# Patient Record
Sex: Female | Born: 1965
Health system: Southern US, Community
[De-identification: ages and names within clinical notes are randomized; demographics above are authoritative.]

## PROBLEM LIST (undated history)

## (undated) DIAGNOSIS — E119 Type 2 diabetes mellitus without complications: Secondary | ICD-10-CM

## (undated) DIAGNOSIS — F419 Anxiety disorder, unspecified: Secondary | ICD-10-CM

## (undated) DIAGNOSIS — K219 Gastro-esophageal reflux disease without esophagitis: Secondary | ICD-10-CM

## (undated) DIAGNOSIS — E039 Hypothyroidism, unspecified: Secondary | ICD-10-CM

## (undated) DIAGNOSIS — T7840XA Allergy, unspecified, initial encounter: Secondary | ICD-10-CM

## (undated) HISTORY — DX: Allergy, unspecified, initial encounter: T78.40XA

## (undated) HISTORY — DX: Gastro-esophageal reflux disease without esophagitis: K21.9

## (undated) HISTORY — PX: HERNIA REPAIR: SHX51

## (undated) HISTORY — DX: Hypothyroidism, unspecified: E03.9

## (undated) HISTORY — DX: Anxiety disorder, unspecified: F41.9

## (undated) HISTORY — PX: BREAST SURGERY: SHX581

## (undated) HISTORY — DX: Type 2 diabetes mellitus without complications: E11.9

## (undated) HISTORY — PX: CYSTECTOMY: SUR359

## (undated) HISTORY — PX: TUBAL LIGATION: SHX77

---

## 2011-01-02 ENCOUNTER — Ambulatory Visit (INDEPENDENT_AMBULATORY_CARE_PROVIDER_SITE_OTHER): Payer: Managed Care, Other (non HMO) | Admitting: Internal Medicine

## 2011-01-02 ENCOUNTER — Encounter: Payer: Self-pay | Admitting: Internal Medicine

## 2011-01-02 VITALS — BP 120/70 | HR 91 | Temp 98.9°F | Ht 63.0 in | Wt 191.0 lb

## 2011-01-02 DIAGNOSIS — Z0000 Encounter for general adult medical examination without abnormal findings: Secondary | ICD-10-CM

## 2011-01-02 DIAGNOSIS — Z79899 Other long term (current) drug therapy: Secondary | ICD-10-CM

## 2011-01-02 DIAGNOSIS — E039 Hypothyroidism, unspecified: Secondary | ICD-10-CM | POA: Insufficient documentation

## 2011-01-02 DIAGNOSIS — F419 Anxiety disorder, unspecified: Secondary | ICD-10-CM | POA: Insufficient documentation

## 2011-01-02 DIAGNOSIS — D649 Anemia, unspecified: Secondary | ICD-10-CM

## 2011-01-02 DIAGNOSIS — E785 Hyperlipidemia, unspecified: Secondary | ICD-10-CM | POA: Insufficient documentation

## 2011-01-02 DIAGNOSIS — Z23 Encounter for immunization: Secondary | ICD-10-CM

## 2011-01-02 LAB — BASIC METABOLIC PANEL
BUN: 10 mg/dL (ref 6–23)
Calcium: 9.2 mg/dL (ref 8.4–10.5)
GFR: 73.49 mL/min (ref >60.00)
Glucose, Bld: 85 mg/dL (ref 70–99)
Potassium: 4.1 mEq/L (ref 3.5–5.1)
Sodium: 140 mEq/L (ref 135–145)

## 2011-01-02 LAB — HEPATIC FUNCTION PANEL
AST: 26 U/L (ref 0–37)
Albumin: 4.2 g/dL (ref 3.5–5.2)
Alkaline Phosphatase: 58 U/L (ref 39–117)
Total Bilirubin: 0.6 mg/dL (ref 0.3–1.2)

## 2011-01-02 LAB — CBC WITH DIFFERENTIAL/PLATELET
Eosinophils Absolute: 0.4 10*3/uL (ref 0.0–0.7)
HCT: 37.9 % (ref 36.0–46.0)
Lymphs Abs: 1.6 10*3/uL (ref 0.7–4.0)
MCHC: 34.2 g/dL (ref 30.0–36.0)
MCV: 87.4 fl (ref 78.0–100.0)
Monocytes Absolute: 0.6 10*3/uL (ref 0.1–1.0)
Neutrophils Relative %: 56.5 % (ref 43.0–77.0)
Platelets: 207 10*3/uL (ref 150.0–400.0)

## 2011-01-02 LAB — LDL CHOLESTEROL, DIRECT: Direct LDL: 133.7 mg/dL

## 2011-01-02 LAB — T4, FREE: Free T4: 0.93 ng/dL (ref 0.60–1.60)

## 2011-01-02 LAB — TSH: TSH: 0.52 u[IU]/mL (ref 0.35–5.50)

## 2011-01-02 MED ORDER — VENLAFAXINE HCL ER 37.5 MG PO CP24: 37.5000 mg | ORAL_CAPSULE | Freq: Every day | ORAL | Status: DC

## 2011-01-02 MED ORDER — OMEPRAZOLE 20 MG PO CPDR: 20.0000 mg | DELAYED_RELEASE_CAPSULE | Freq: Every day | ORAL | Status: DC

## 2011-01-02 MED ORDER — LEVOTHYROXINE SODIUM 150 MCG PO TABS: 150.0000 ug | ORAL_TABLET | Freq: Every day | ORAL | Status: DC

## 2011-01-02 MED ORDER — DIAZEPAM 5 MG PO TABS: 5.0000 mg | ORAL_TABLET | Freq: Two times a day (BID) | ORAL | Status: DC | PRN

## 2011-01-02 NOTE — Assessment & Plan Note (Signed)
Obtain fasting lipid profile.

## 2011-01-02 NOTE — Assessment & Plan Note (Signed)
Obtain TSH and free T4. RF synthroid at current dose pending lab results.

## 2011-01-02 NOTE — Progress Notes (Signed)
  Subjective:    Patient ID: Gabriela Conley, female    DOB: 25-Apr-1966, 45 y.o.   MRN: [id number]  HPI Pt presents to clinic to establish primary care. Moved from [place] 05/2010 and had CPE with labs and pap prior to the move. H/o hypothyroidism on stable dose of synthroid. Compliant with medication without adverse effect. Notes unintended wt gain. Recalls past h/o anemia thought to be iron deficiency. Was recommended for po iron supplementation however states takes periodically. No gross active bleeding. H/o GERD well controlled with daily ppi without breakthrough sx's. No heartburn or dysphagia. In past cholesterol has been mildly elevated but has not required medication. Notes h/o anxiety which was previously treated with effexor xr without adverse effect. Pt self dc'ed medication. Recently has been tx'ed with valium prn and takes the medication several days a week. No alleviating or exacerbating factors.   Reviewed PMH, PSH, medications, allergies, social hx and family hx.   Review of Systems  Constitutional: Negative for fever, chills and fatigue.  HENT: Positive for congestion. Negative for hearing loss, ear pain and facial swelling.   Eyes: Negative for photophobia, pain and discharge.  Respiratory: Negative for cough, shortness of breath and wheezing.   Cardiovascular: Negative for chest pain and palpitations.  Gastrointestinal: Negative for nausea, vomiting, abdominal pain, diarrhea, constipation and blood in stool.  Genitourinary: Negative for frequency, hematuria and difficulty urinating.  Musculoskeletal: Negative for myalgias and back pain.  Skin: Negative for color change, pallor and rash.  Neurological: Negative for dizziness, seizures, syncope and numbness.  Hematological: Negative for adenopathy. Does not bruise/bleed easily.  Psychiatric/Behavioral: Negative for behavioral problems and agitation. The patient is nervous/anxious.        Objective:   Physical Exam  Constitutional: She  appears well-developed and well-nourished. No distress.  HENT:  Head: Normocephalic and atraumatic.  Right Ear: Tympanic membrane, external ear and ear canal normal.  Left Ear: Tympanic membrane, external ear and ear canal normal.  Nose: Nose normal.  Mouth/Throat: Oropharynx is clear and moist. No oropharyngeal exudate.  Eyes: Conjunctivae are normal. Right eye exhibits no discharge. Left eye exhibits no discharge. No scleral icterus.  Neck: Neck supple. Carotid bruit is not present. No thyromegaly present.  Cardiovascular: Normal rate, regular rhythm and normal heart sounds.  Exam reveals no gallop and no friction rub.   No murmur heard. Pulmonary/Chest: Effort normal and breath sounds normal. No respiratory distress. She has no wheezes. She has no rales.  Abdominal: Soft. Bowel sounds are normal. She exhibits no distension and no mass. There is no hepatosplenomegaly. There is no tenderness. There is no rebound and no guarding.  Lymphadenopathy:    She has no cervical adenopathy.  Neurological: She is alert. Gait normal.  Skin: Skin is warm and dry. No rash noted. She is not diaphoretic. No erythema.  Psychiatric: She has a normal mood and affect. Her speech is normal and behavior is normal. Cognition and memory are not impaired. She does not express impulsivity or inappropriate judgment.          Assessment & Plan:

## 2011-01-02 NOTE — Assessment & Plan Note (Signed)
Obtain CBC and iron level

## 2011-01-09 ENCOUNTER — Telehealth: Payer: Self-pay

## 2011-01-09 NOTE — Telephone Encounter (Signed)
Message copied by Kyung Rudd on Wed Jan 09, 2011  8:52 AM ------      Message from: Letitia Libra, Maisie Fus      Created: Tue Jan 08, 2011  9:31 PM       Labs nl except chol mildly abnormal. Low fat diet and regular exercise.       Not anemic. Thyroid ok.

## 2011-01-09 NOTE — Telephone Encounter (Signed)
Pt aware.

## 2011-07-12 ENCOUNTER — Other Ambulatory Visit: Payer: Managed Care, Other (non HMO)

## 2011-07-16 ENCOUNTER — Other Ambulatory Visit (INDEPENDENT_AMBULATORY_CARE_PROVIDER_SITE_OTHER): Payer: Managed Care, Other (non HMO)

## 2011-07-16 DIAGNOSIS — Z0000 Encounter for general adult medical examination without abnormal findings: Secondary | ICD-10-CM

## 2011-07-16 LAB — POCT URINALYSIS DIPSTICK
Blood, UA: NEGATIVE
Glucose, UA: NEGATIVE
Ketones, UA: NEGATIVE
Spec Grav, UA: 1.02

## 2011-07-16 LAB — CBC WITH DIFFERENTIAL/PLATELET
Eosinophils Relative: 4.7 % (ref 0.0–5.0)
HCT: 39 % (ref 36.0–46.0)
Hemoglobin: 12.8 g/dL (ref 12.0–15.0)
Lymphs Abs: 1.3 10*3/uL (ref 0.7–4.0)
Monocytes Relative: 9.6 % (ref 3.0–12.0)
Neutro Abs: 4.7 10*3/uL (ref 1.4–7.7)
Platelets: 179 10*3/uL (ref 150.0–400.0)
RBC: 4.45 Mil/uL (ref 3.87–5.11)
WBC: 7.1 10*3/uL (ref 4.5–10.5)

## 2011-07-16 LAB — HEPATIC FUNCTION PANEL
ALT: 24 U/L (ref 0–35)
Albumin: 4.2 g/dL (ref 3.5–5.2)
Total Bilirubin: 0.4 mg/dL (ref 0.3–1.2)
Total Protein: 7.1 g/dL (ref 6.0–8.3)

## 2011-07-16 LAB — BASIC METABOLIC PANEL
GFR: 90.71 mL/min (ref >60.00)
Potassium: 3.8 mEq/L (ref 3.5–5.1)
Sodium: 139 mEq/L (ref 135–145)

## 2011-07-16 LAB — LIPID PANEL
Cholesterol: 206 mg/dL — ABNORMAL HIGH (ref 0–200)
HDL: 38.1 mg/dL — ABNORMAL LOW (ref >39.00)
Triglycerides: 213 mg/dL — ABNORMAL HIGH (ref 0.0–149.0)

## 2011-07-16 LAB — TSH: TSH: 1.79 u[IU]/mL (ref 0.35–5.50)

## 2011-07-17 LAB — LDL CHOLESTEROL, DIRECT: Direct LDL: 139.3 mg/dL

## 2011-07-19 ENCOUNTER — Encounter: Payer: Self-pay | Admitting: Family Medicine

## 2011-07-19 ENCOUNTER — Other Ambulatory Visit (HOSPITAL_COMMUNITY)
Admission: RE | Admit: 2011-07-19 | Discharge: 2011-07-19 | Disposition: A | Discharge status: Home / Self Care | Payer: Managed Care, Other (non HMO) | Source: Ambulatory Visit | Attending: Family Medicine | Admitting: Family Medicine

## 2011-07-19 ENCOUNTER — Encounter: Payer: Managed Care, Other (non HMO) | Admitting: Internal Medicine

## 2011-07-19 ENCOUNTER — Ambulatory Visit (INDEPENDENT_AMBULATORY_CARE_PROVIDER_SITE_OTHER): Payer: Managed Care, Other (non HMO) | Admitting: Family Medicine

## 2011-07-19 VITALS — BP 118/78 | HR 93 | Temp 99.0°F | Ht 63.25 in | Wt 193.0 lb

## 2011-07-19 DIAGNOSIS — Z0000 Encounter for general adult medical examination without abnormal findings: Secondary | ICD-10-CM

## 2011-07-19 DIAGNOSIS — Z01419 Encounter for gynecological examination (general) (routine) without abnormal findings: Secondary | ICD-10-CM | POA: Insufficient documentation

## 2011-07-19 MED ORDER — DIAZEPAM 5 MG PO TABS: 5.0000 mg | ORAL_TABLET | Freq: Two times a day (BID) | ORAL | Status: DC | PRN

## 2011-07-19 MED ORDER — DICLOFENAC SODIUM 50 MG PO TBEC: 50.0000 mg | DELAYED_RELEASE_TABLET | Freq: Three times a day (TID) | ORAL | Status: DC | PRN

## 2011-07-19 MED ORDER — CYCLOBENZAPRINE HCL 10 MG PO TABS: 10.0000 mg | ORAL_TABLET | Freq: Three times a day (TID) | ORAL | Status: AC | PRN

## 2011-07-19 MED ORDER — LASIX 20 MG PO TABS: 20.0000 mg | ORAL_TABLET | Freq: Every day | ORAL | Status: DC | PRN

## 2011-07-19 NOTE — Progress Notes (Signed)
Subjective:    Patient ID: Gabriela Conley, female    DOB: Mar 22, 1966, 45 y.o.   MRN: [id number]  HPI 45 yr old female for a cpx. She is doing well except for some middle back pain and stiffness that started about 3 months ago. No hx of trauma. The pains are mostly on the left side, and they come and go. Heat and 800 mg of Ibuprofen do not help much. Her menses are regular.    Review of Systems  Constitutional: Negative.  Negative for fever, diaphoresis, activity change, appetite change, fatigue and unexpected weight change.  HENT: Negative.  Negative for hearing loss, ear pain, nosebleeds, congestion, sore throat, trouble swallowing, neck pain, neck stiffness, voice change and tinnitus.   Eyes: Negative.  Negative for photophobia, pain, discharge, redness and visual disturbance.  Respiratory: Negative.  Negative for apnea, cough, choking, chest tightness, shortness of breath, wheezing and stridor.   Cardiovascular: Negative.  Negative for chest pain, palpitations and leg swelling.  Gastrointestinal: Negative.  Negative for nausea, vomiting, abdominal pain, diarrhea, constipation, blood in stool, abdominal distention and rectal pain.  Genitourinary: Negative.  Negative for dysuria, urgency, frequency, hematuria, flank pain, vaginal bleeding, vaginal discharge, enuresis, difficulty urinating, vaginal pain and menstrual problem.  Musculoskeletal: Negative.  Negative for myalgias, back pain, joint swelling, arthralgias and gait problem.  Skin: Negative.  Negative for color change, pallor, rash and wound.  Neurological: Negative.  Negative for dizziness, tremors, seizures, syncope, speech difficulty, weakness, light-headedness, numbness and headaches.  Hematological: Negative.  Negative for adenopathy. Does not bruise/bleed easily.  Psychiatric/Behavioral: Negative.  Negative for hallucinations, behavioral problems, confusion, sleep disturbance, dysphoric mood and agitation. The patient is not  nervous/anxious.        Objective:   Physical Exam  Constitutional: She appears well-developed and well-nourished. No distress.  HENT:  Head: Normocephalic and atraumatic.  Right Ear: External ear normal.  Left Ear: External ear normal.  Nose: Nose normal.  Mouth/Throat: Oropharynx is clear and moist. No oropharyngeal exudate.  Eyes: Conjunctivae and EOM are normal. Pupils are equal, round, and reactive to light. Right eye exhibits no discharge. Left eye exhibits no discharge. No scleral icterus.  Neck: Normal range of motion. Neck supple. No JVD present. No thyromegaly present.  Cardiovascular: Normal rate, regular rhythm, normal heart sounds and intact distal pulses.  Exam reveals no gallop and no friction rub.   No murmur heard. Pulmonary/Chest: Effort normal and breath sounds normal. No stridor. No respiratory distress. She has no wheezes. She has no rales. She exhibits no tenderness.  Abdominal: Soft. Normal appearance and bowel sounds are normal. She exhibits no distension, no abdominal bruit, no ascites and no mass. There is no hepatosplenomegaly. There is no tenderness. There is no rigidity, no rebound and no guarding. No hernia.  Genitourinary: Rectum normal, vagina normal and uterus normal. No breast swelling, tenderness, discharge or bleeding. Cervix exhibits no motion tenderness, no discharge and no friability. Right adnexum displays no mass, no tenderness and no fullness. Left adnexum displays no mass, no tenderness and no fullness. No erythema, tenderness or bleeding around the vagina. No vaginal discharge found.  Musculoskeletal: Normal range of motion. She exhibits no edema and no tenderness.  Lymphadenopathy:    She has no cervical adenopathy.  Neurological: She is alert. She has normal reflexes. No cranial nerve deficit. She exhibits normal muscle tone. Coordination normal.  Skin: Skin is warm and dry. No rash noted. She is not diaphoretic. No erythema. No pallor.  Psychiatric: She has a normal mood and affect. Her behavior is normal. Judgment and thought content normal.          Assessment & Plan:  Well exam. She has muscle spasms in the back. Try Flexeril and Diclofenac. I suggested she get a massage. She needs to set up a mammogram soon.

## 2011-07-22 ENCOUNTER — Encounter: Payer: Managed Care, Other (non HMO) | Admitting: Internal Medicine

## 2011-07-25 ENCOUNTER — Telehealth: Payer: Self-pay | Admitting: Family Medicine

## 2011-07-25 NOTE — Telephone Encounter (Signed)
Spoke with pt and gave results. 

## 2011-07-25 NOTE — Telephone Encounter (Signed)
Message copied by Baldemar Friday on Thu Jul 25, 2011  3:17 PM ------      Message from: Gershon Crane A      Created: Wed Jul 24, 2011  8:46 AM       Normal, repeat one year

## 2011-08-20 ENCOUNTER — Encounter: Payer: Self-pay | Admitting: Family Medicine

## 2011-08-20 ENCOUNTER — Ambulatory Visit (INDEPENDENT_AMBULATORY_CARE_PROVIDER_SITE_OTHER): Payer: Managed Care, Other (non HMO) | Admitting: Family Medicine

## 2011-08-20 VITALS — BP 111/62 | HR 86 | Temp 99.3°F | Wt 199.0 lb

## 2011-08-20 DIAGNOSIS — J329 Chronic sinusitis, unspecified: Secondary | ICD-10-CM

## 2011-08-20 MED ORDER — AMOXICILLIN-POT CLAVULANATE 875-125 MG PO TABS: 1.0000 | ORAL_TABLET | Freq: Two times a day (BID) | ORAL | Status: AC

## 2011-08-20 NOTE — Progress Notes (Signed)
  Subjective:    Patient ID: Gabriela Conley, female    DOB: [birth year], 45 y.o.   MRN: [id number]  HPI Here for one week of sinus pressure, PND, HA, and a dry cough. Low grade fevers.    Review of Systems  Constitutional: Positive for fever.  HENT: Positive for congestion, postnasal drip and sinus pressure.   Eyes: Negative.   Respiratory: Positive for cough.        Objective:   Physical Exam  Constitutional: She appears well-developed and well-nourished.  HENT:  Right Ear: External ear normal.  Left Ear: External ear normal.  Nose: Nose normal.  Mouth/Throat: Oropharynx is clear and moist. No oropharyngeal exudate.  Eyes: Conjunctivae are normal. Pupils are equal, round, and reactive to light.  Neck: No thyromegaly present.  Pulmonary/Chest: Effort normal and breath sounds normal.  Lymphadenopathy:    She has no cervical adenopathy.          Assessment & Plan:  Out of work today

## 2011-12-20 ENCOUNTER — Ambulatory Visit (INDEPENDENT_AMBULATORY_CARE_PROVIDER_SITE_OTHER): Payer: Managed Care, Other (non HMO) | Admitting: Family

## 2011-12-20 ENCOUNTER — Encounter: Payer: Self-pay | Admitting: Family

## 2011-12-20 VITALS — HR 91 | Temp 98.3°F | Wt 198.0 lb

## 2011-12-20 DIAGNOSIS — J309 Allergic rhinitis, unspecified: Secondary | ICD-10-CM

## 2011-12-20 DIAGNOSIS — E039 Hypothyroidism, unspecified: Secondary | ICD-10-CM

## 2011-12-20 DIAGNOSIS — F411 Generalized anxiety disorder: Secondary | ICD-10-CM

## 2011-12-20 DIAGNOSIS — F419 Anxiety disorder, unspecified: Secondary | ICD-10-CM

## 2011-12-20 MED ORDER — FEXOFENADINE-PSEUDOEPHED ER 180-240 MG PO TB24: 1.0000 | ORAL_TABLET | Freq: Every day | ORAL | Status: DC

## 2011-12-20 MED ORDER — DIAZEPAM 5 MG PO TABS: 5.0000 mg | ORAL_TABLET | Freq: Two times a day (BID) | ORAL | Status: DC | PRN

## 2011-12-20 MED ORDER — LEVOTHYROXINE SODIUM 150 MCG PO TABS: 150.0000 ug | ORAL_TABLET | Freq: Every day | ORAL | Status: DC

## 2011-12-20 NOTE — Progress Notes (Signed)
Subjective:    Patient ID: Gabriela Conley, female    DOB: 1966-02-24, 46 y.o.   MRN: [id number]  HPI Comments: C/o sinus congestion, mild headaches, itchy/watery eyes, productive cough with green-yellow tinged mucus expectorated x five days. Denies fever, chills, nausea, or vomiting. Mucinex and tylenol sinus OTC ineffective. REquest refill synthroid and diazepam.   Sinusitis Associated symptoms include congestion, sinus pressure and a sore throat. Pertinent negatives include no ear pain or sneezing.      Review of Systems  Constitutional: Negative.   HENT: Positive for congestion, sore throat, postnasal drip and sinus pressure. Negative for hearing loss, ear pain, facial swelling, rhinorrhea, sneezing, trouble swallowing, dental problem and ear discharge.   Eyes: Negative.   Respiratory: Negative.   Cardiovascular: Negative.    Past Medical History  Diagnosis Date  . Asthma   . Allergy   . Hypothyroid   . GERD (gastroesophageal reflux disease)   . Anxiety     History   Social History  . Marital Status: Single    Spouse Name: N/A    Number of Children: N/A  . Years of Education: N/A   Occupational History  . Not on file.   Social History Main Topics  . Smoking status: Former Games developer  . Smokeless tobacco: Never Used  . Alcohol Use: Yes  . Drug Use: No  . Sexually Active: Not on file   Other Topics Concern  . Not on file   Social History Narrative  . No narrative on file    Past Surgical History  Procedure Date  . Cystectomy     removed from chest  . Hernia repair   . Breast surgery     removal of benign cyst     Family History  Problem Relation Age of Onset  . Uterine cancer Mother   . Emphysema Mother   . Stomach cancer Father     No Known Allergies  Current Outpatient Prescriptions on File Prior to Visit  Medication Sig Dispense Refill  . albuterol (PROAIR HFA) 108 (90 BASE) MCG/ACT inhaler Inhale 2 puffs into the lungs every 6 (six) hours as  needed.        . diazepam (VALIUM) 5 MG tablet Take 1 tablet (5 mg total) by mouth every 12 (twelve) hours as needed for anxiety.  180 tablet  1  . diclofenac (VOLTAREN) 50 MG EC tablet Take 1 tablet (50 mg total) by mouth 3 (three) times daily as needed (pain ).  60 tablet  5  . levothyroxine (SYNTHROID, LEVOTHROID) 150 MCG tablet Take 1 tablet (150 mcg total) by mouth daily.  90 tablet  3  . omeprazole (PRILOSEC) 20 MG capsule Take 1 capsule (20 mg total) by mouth daily.  90 capsule  3  . venlafaxine (EFFEXOR XR) 37.5 MG 24 hr capsule Take 1 capsule (37.5 mg total) by mouth daily.  30 capsule  6  . LASIX 20 MG tablet Take 1 tablet (20 mg total) by mouth daily as needed.  30 tablet  11  . loratadine (CLARITIN) 10 MG tablet Take 10 mg by mouth daily.          Pulse 91  Temp(Src) 98.3 F (36.8 C) (Oral)  Wt 198 lb (89.812 kg)  SpO2 98%chart    Objective:   Physical Exam  Constitutional: She is oriented to person, place, and time. She appears well-developed and well-nourished. No distress.  HENT:  Right Ear: External ear normal.  Left Ear: External ear normal.  Nose: Nose normal.  Mouth/Throat: Oropharynx is clear and moist. No oropharyngeal exudate.  Eyes: Right eye exhibits no discharge. Left eye exhibits no discharge.  Cardiovascular: Normal rate, regular rhythm, normal heart sounds and intact distal pulses.  Exam reveals no gallop and no friction rub.   No murmur heard. Pulmonary/Chest: Effort normal and breath sounds normal. No respiratory distress. She has no wheezes. She has no rales. She exhibits no tenderness.  Neurological: She is alert and oriented to person, place, and time.  Skin: Skin is warm and dry.          Assessment & Plan:  Assessent: Allergic Rhinitis-uncontrolled, Hypothyroidism, Anxiety  Plan: Allegra-d, synthroid, diazepam, increase po fluid intake, lab: TSH,  follow up with primary care MD, teaching handout provided sinusitis and hypothryoidism

## 2011-12-20 NOTE — Patient Instructions (Signed)

## 2012-01-17 ENCOUNTER — Ambulatory Visit (INDEPENDENT_AMBULATORY_CARE_PROVIDER_SITE_OTHER): Payer: Managed Care, Other (non HMO) | Admitting: Family Medicine

## 2012-01-17 ENCOUNTER — Encounter: Payer: Self-pay | Admitting: Family Medicine

## 2012-01-17 VITALS — BP 110/82 | HR 83 | Temp 98.7°F | Wt 191.0 lb

## 2012-01-17 DIAGNOSIS — J329 Chronic sinusitis, unspecified: Secondary | ICD-10-CM

## 2012-01-17 MED ORDER — AZITHROMYCIN 250 MG PO TABS: ORAL_TABLET | ORAL | Status: AC

## 2012-01-17 MED ORDER — DIAZEPAM 5 MG PO TABS: 5.0000 mg | ORAL_TABLET | Freq: Two times a day (BID) | ORAL | Status: DC | PRN

## 2012-01-17 NOTE — Progress Notes (Signed)
  Subjective:    Patient ID: Gabriela Conley, female    DOB: 1966/04/21, 46 y.o.   MRN: [id number]  HPI Here for 3 weeks of sinus pressure, PND, and coughing up green sputum. No fever. On Allegra.    Review of Systems  Constitutional: Negative.   HENT: Positive for congestion, postnasal drip and sinus pressure.   Eyes: Negative.   Respiratory: Positive for cough.        Objective:   Physical Exam  Constitutional: She appears well-developed and well-nourished.  HENT:  Right Ear: External ear normal.  Left Ear: External ear normal.  Nose: Nose normal.  Mouth/Throat: Oropharynx is clear and moist. No oropharyngeal exudate.  Eyes: Conjunctivae are normal.  Neck: Neck supple.  Pulmonary/Chest: Effort normal and breath sounds normal.  Lymphadenopathy:    She has no cervical adenopathy.          Assessment & Plan:  Switch to Mucinex

## 2012-01-20 ENCOUNTER — Ambulatory Visit: Payer: Managed Care, Other (non HMO) | Admitting: Family Medicine

## 2012-01-22 ENCOUNTER — Other Ambulatory Visit: Payer: Self-pay | Admitting: Internal Medicine

## 2012-03-02 ENCOUNTER — Telehealth: Payer: Self-pay | Admitting: Family Medicine

## 2012-03-02 MED ORDER — LEVOTHYROXINE SODIUM 150 MCG PO TABS: 150.0000 ug | ORAL_TABLET | Freq: Every day | ORAL | Status: DC

## 2012-03-02 NOTE — Telephone Encounter (Signed)
Pt requested a 90 day supply of Synthroid [facility], I sent e-scribe.

## 2012-03-10 ENCOUNTER — Telehealth: Payer: Self-pay | Admitting: Family Medicine

## 2012-03-10 NOTE — Telephone Encounter (Signed)
Okay for one year supply  

## 2012-03-10 NOTE — Telephone Encounter (Signed)
Refill request for Venlafaxine HCL ER 37.5 mg take 1 po qd and pt last here on 01/17/12. ( 90 day supply )

## 2012-03-11 MED ORDER — VENLAFAXINE HCL ER 37.5 MG PO CP24: 37.5000 mg | ORAL_CAPSULE | Freq: Every day | ORAL | Status: DC

## 2012-03-11 NOTE — Telephone Encounter (Signed)
Script was sent e-scribe 

## 2012-07-03 ENCOUNTER — Ambulatory Visit (INDEPENDENT_AMBULATORY_CARE_PROVIDER_SITE_OTHER): Payer: Managed Care, Other (non HMO) | Admitting: Family Medicine

## 2012-07-03 ENCOUNTER — Encounter: Payer: Self-pay | Admitting: Family Medicine

## 2012-07-03 VITALS — BP 108/70 | HR 91 | Temp 98.7°F | Wt 192.0 lb

## 2012-07-03 DIAGNOSIS — J329 Chronic sinusitis, unspecified: Secondary | ICD-10-CM

## 2012-07-03 DIAGNOSIS — E039 Hypothyroidism, unspecified: Secondary | ICD-10-CM

## 2012-07-03 LAB — BASIC METABOLIC PANEL
Chloride: 102 mEq/L (ref 96–112)
Glucose, Bld: 107 mg/dL — ABNORMAL HIGH (ref 70–99)
Potassium: 3.7 mEq/L (ref 3.5–5.3)
Sodium: 139 mEq/L (ref 135–145)

## 2012-07-03 LAB — CBC WITH DIFFERENTIAL/PLATELET
Basophils Absolute: 0 10*3/uL (ref 0.0–0.1)
Eosinophils Relative: 5 % (ref 0–5)
HCT: 39.7 % (ref 36.0–46.0)
Lymphocytes Relative: 19 % (ref 12–46)
Lymphs Abs: 1.5 10*3/uL (ref 0.7–4.0)
MCH: 30.4 pg (ref 26.0–34.0)
MCV: 88.6 fL (ref 78.0–100.0)
Monocytes Absolute: 0.8 10*3/uL (ref 0.1–1.0)
RDW: 13.4 % (ref 11.5–15.5)
WBC: 7.9 10*3/uL (ref 4.0–10.5)

## 2012-07-03 MED ORDER — AZITHROMYCIN 250 MG PO TABS: ORAL_TABLET | ORAL | Status: DC

## 2012-07-03 NOTE — Addendum Note (Signed)
Addended by: Mauri Reading on: 07/03/2012 04:02 PM   Modules accepted: Orders

## 2012-07-03 NOTE — Progress Notes (Signed)
  Subjective:    Patient ID: Gabriela Conley, female    DOB: 12/04/1965, 46 y.o.   MRN: [id number]  HPI Here for one week of sinus pressure, PND, ST, and a dry cough. No fever.    Review of Systems  Constitutional: Negative.   HENT: Positive for congestion, postnasal drip and sinus pressure.   Eyes: Negative.   Respiratory: Positive for cough.        Objective:   Physical Exam  Constitutional: She appears well-developed and well-nourished.  HENT:  Right Ear: External ear normal.  Left Ear: External ear normal.  Nose: Nose normal.  Mouth/Throat: Oropharynx is clear and moist. No oropharyngeal exudate.  Eyes: Conjunctivae are normal.  Pulmonary/Chest: Effort normal and breath sounds normal.  Lymphadenopathy:    She has no cervical adenopathy.          Assessment & Plan:  Add Mucinex

## 2012-07-07 ENCOUNTER — Other Ambulatory Visit: Payer: Self-pay | Admitting: Family Medicine

## 2012-07-07 MED ORDER — LEVOTHYROXINE SODIUM 200 MCG PO TABS: 200.0000 ug | ORAL_TABLET | Freq: Every day | ORAL | Status: DC

## 2012-07-07 NOTE — Progress Notes (Signed)
Quick Note:  Left a message for pt to return call. ______ 

## 2012-08-21 ENCOUNTER — Telehealth: Payer: Self-pay | Admitting: Family Medicine

## 2012-08-21 DIAGNOSIS — Z0000 Encounter for general adult medical examination without abnormal findings: Secondary | ICD-10-CM

## 2012-08-21 NOTE — Telephone Encounter (Signed)
Please call pt and schedule a fasting lab appointment. She left a form to fill out and we need a lipid panel to complete the form. She does not need to see the doctor only labs. I did put the future order in computer.

## 2012-08-24 NOTE — Telephone Encounter (Signed)
Lm with patient to schedule fasting labs.

## 2012-08-31 ENCOUNTER — Other Ambulatory Visit (INDEPENDENT_AMBULATORY_CARE_PROVIDER_SITE_OTHER): Payer: Managed Care, Other (non HMO)

## 2012-08-31 DIAGNOSIS — Z0000 Encounter for general adult medical examination without abnormal findings: Secondary | ICD-10-CM

## 2012-08-31 LAB — LIPID PANEL
HDL: 30.4 mg/dL — ABNORMAL LOW (ref >39.00)
Total CHOL/HDL Ratio: 7
Triglycerides: 247 mg/dL — ABNORMAL HIGH (ref 0.0–149.0)

## 2012-08-31 LAB — LDL CHOLESTEROL, DIRECT: Direct LDL: 129.4 mg/dL

## 2012-09-02 NOTE — Progress Notes (Signed)
Quick Note:  I left voice message with results. ______ 

## 2012-11-06 ENCOUNTER — Telehealth: Payer: Self-pay | Admitting: Family Medicine

## 2012-11-06 NOTE — Telephone Encounter (Signed)
Pt left a voice message requesting to refax over the screening results that she had done. ( fax # [phone] ) and call pt.

## 2012-11-11 DIAGNOSIS — Z0279 Encounter for issue of other medical certificate: Secondary | ICD-10-CM

## 2012-11-11 NOTE — Telephone Encounter (Signed)
Can you call pt after this is faxed, it was in the bin to go today, also there was a charge sheet with it.

## 2012-11-12 ENCOUNTER — Other Ambulatory Visit: Payer: Managed Care, Other (non HMO)

## 2012-11-12 NOTE — Telephone Encounter (Signed)
Unable to call patient home # not in service. Form was refaxed and I received confirmation

## 2012-11-18 ENCOUNTER — Encounter: Payer: Managed Care, Other (non HMO) | Admitting: Family Medicine

## 2012-12-03 ENCOUNTER — Other Ambulatory Visit (INDEPENDENT_AMBULATORY_CARE_PROVIDER_SITE_OTHER): Payer: BC Managed Care – PPO

## 2012-12-03 DIAGNOSIS — Z0000 Encounter for general adult medical examination without abnormal findings: Secondary | ICD-10-CM

## 2012-12-03 LAB — CBC WITH DIFFERENTIAL/PLATELET
Basophils Absolute: 0 10*3/uL (ref 0.0–0.1)
Basophils Relative: 0.4 % (ref 0.0–3.0)
Eosinophils Absolute: 0.4 10*3/uL (ref 0.0–0.7)
Eosinophils Relative: 6.1 % — ABNORMAL HIGH (ref 0.0–5.0)
HCT: 39.2 % (ref 36.0–46.0)
Hemoglobin: 13.3 g/dL (ref 12.0–15.0)
Lymphocytes Relative: 20.8 % (ref 12.0–46.0)
Lymphs Abs: 1.3 10*3/uL (ref 0.7–4.0)
MCHC: 34 g/dL (ref 30.0–36.0)
MCV: 90.1 fl (ref 78.0–100.0)
Monocytes Absolute: 0.7 10*3/uL (ref 0.1–1.0)
Monocytes Relative: 11.8 % (ref 3.0–12.0)
Neutro Abs: 3.8 10*3/uL (ref 1.4–7.7)
Neutrophils Relative %: 60.9 % (ref 43.0–77.0)
Platelets: 192 10*3/uL (ref 150.0–400.0)
RBC: 4.34 Mil/uL (ref 3.87–5.11)
RDW: 13.8 % (ref 11.5–14.6)
WBC: 6.3 10*3/uL (ref 4.5–10.5)

## 2012-12-03 LAB — POCT URINALYSIS DIPSTICK
Bilirubin, UA: NEGATIVE
Glucose, UA: NEGATIVE
Ketones, UA: NEGATIVE
Nitrite, UA: NEGATIVE
Spec Grav, UA: >=1.03
Urobilinogen, UA: 0.2
pH, UA: 6

## 2012-12-03 LAB — COMPREHENSIVE METABOLIC PANEL
AST: 27 U/L (ref 0–37)
Alkaline Phosphatase: 60 U/L (ref 39–117)
BUN: 13 mg/dL (ref 6–23)
Creatinine, Ser: 0.7 mg/dL (ref 0.4–1.2)
Potassium: 3.7 mEq/L (ref 3.5–5.1)
Total Bilirubin: 0.5 mg/dL (ref 0.3–1.2)

## 2012-12-03 LAB — TSH: TSH: 1.87 u[IU]/mL (ref 0.35–5.50)

## 2012-12-03 LAB — LIPID PANEL
HDL: 29.1 mg/dL — ABNORMAL LOW (ref >39.00)
VLDL: 35 mg/dL (ref 0.0–40.0)

## 2012-12-03 LAB — LDL CHOLESTEROL, DIRECT: Direct LDL: 135.6 mg/dL

## 2012-12-07 ENCOUNTER — Encounter: Payer: Self-pay | Admitting: Family Medicine

## 2012-12-07 MED ORDER — CIPROFLOXACIN HCL 500 MG PO TABS: 500.0000 mg | ORAL_TABLET | Freq: Two times a day (BID) | ORAL | Status: DC

## 2012-12-07 NOTE — Progress Notes (Signed)
Quick Note:  I tried to reach the pt by phone, no answer or option to leave a message. I called in the below script to [facility] on [address] [phone] and I put a copy of results in mail to pt. ______

## 2012-12-07 NOTE — Addendum Note (Signed)
Addended by: Aniceto Boss A on: 12/07/2012 02:36 PM   Modules accepted: Orders

## 2012-12-15 ENCOUNTER — Ambulatory Visit (INDEPENDENT_AMBULATORY_CARE_PROVIDER_SITE_OTHER): Payer: BC Managed Care – PPO | Admitting: Family Medicine

## 2012-12-15 ENCOUNTER — Encounter: Payer: Self-pay | Admitting: Family Medicine

## 2012-12-15 ENCOUNTER — Other Ambulatory Visit (HOSPITAL_COMMUNITY)
Admission: RE | Admit: 2012-12-15 | Discharge: 2012-12-15 | Disposition: A | Discharge status: Home / Self Care | Payer: BC Managed Care – PPO | Source: Ambulatory Visit | Attending: Family Medicine | Admitting: Family Medicine

## 2012-12-15 VITALS — BP 114/80 | HR 91 | Temp 98.5°F | Ht 63.25 in | Wt 196.0 lb

## 2012-12-15 DIAGNOSIS — J019 Acute sinusitis, unspecified: Secondary | ICD-10-CM

## 2012-12-15 DIAGNOSIS — N920 Excessive and frequent menstruation with regular cycle: Secondary | ICD-10-CM

## 2012-12-15 DIAGNOSIS — Z0000 Encounter for general adult medical examination without abnormal findings: Secondary | ICD-10-CM

## 2012-12-15 DIAGNOSIS — Z01419 Encounter for gynecological examination (general) (routine) without abnormal findings: Secondary | ICD-10-CM | POA: Insufficient documentation

## 2012-12-15 MED ORDER — OMEPRAZOLE 20 MG PO CPDR: 20.0000 mg | DELAYED_RELEASE_CAPSULE | Freq: Every day | ORAL | Status: DC

## 2012-12-15 MED ORDER — AZITHROMYCIN 250 MG PO TABS: ORAL_TABLET | ORAL | Status: DC

## 2012-12-15 NOTE — Addendum Note (Signed)
Addended by: Aniceto Boss A on: 12/15/2012 04:16 PM   Modules accepted: Orders

## 2012-12-15 NOTE — Progress Notes (Signed)
Subjective:    Patient ID: Gabriela Conley, female    DOB: 1966/06/19, 47 y.o.   MRN: [id number]  HPI 47 yr old female for a cpx. She also describes her periods as getting heavier and more painful. They are still regular. She took Lupron shots for this in the past. Also she thinks she has a sinus infeciton. She has had 3 weeks of sinus pressure, PND, ST, and a dry cough.    Review of Systems  Constitutional: Negative.  Negative for fever, diaphoresis, activity change, appetite change, fatigue and unexpected weight change.  HENT: Positive for congestion, postnasal drip and sinus pressure. Negative for hearing loss, ear pain, nosebleeds, sore throat, trouble swallowing, neck pain, neck stiffness, voice change and tinnitus.   Eyes: Negative.  Negative for photophobia, pain, discharge, redness and visual disturbance.  Respiratory: Positive for cough. Negative for apnea, choking, chest tightness, shortness of breath, wheezing and stridor.   Cardiovascular: Negative.  Negative for chest pain, palpitations and leg swelling.  Gastrointestinal: Negative.  Negative for nausea, vomiting, abdominal pain, diarrhea, constipation, blood in stool, abdominal distention and rectal pain.  Genitourinary: Positive for menstrual problem. Negative for dysuria, urgency, frequency, hematuria, flank pain, vaginal bleeding, vaginal discharge, enuresis, difficulty urinating and vaginal pain.  Musculoskeletal: Negative.  Negative for myalgias, back pain, joint swelling, arthralgias and gait problem.  Skin: Negative.  Negative for color change, pallor, rash and wound.  Neurological: Negative.  Negative for dizziness, tremors, seizures, syncope, speech difficulty, weakness, light-headedness, numbness and headaches.  Hematological: Negative for adenopathy. Does not bruise/bleed easily.  Psychiatric/Behavioral: Negative.  Negative for hallucinations, behavioral problems, confusion, sleep disturbance, dysphoric mood and agitation. The  patient is not nervous/anxious.        Objective:   Physical Exam  Constitutional: She appears well-developed and well-nourished. No distress.  HENT:  Head: Normocephalic and atraumatic.  Right Ear: External ear normal.  Left Ear: External ear normal.  Nose: Nose normal.  Mouth/Throat: Oropharynx is clear and moist. No oropharyngeal exudate.  Eyes: Conjunctivae and EOM are normal. Pupils are equal, round, and reactive to light. Right eye exhibits no discharge. Left eye exhibits no discharge. No scleral icterus.  Neck: Normal range of motion. Neck supple. No JVD present. No thyromegaly present.  Cardiovascular: Normal rate, regular rhythm, normal heart sounds and intact distal pulses.  Exam reveals no gallop and no friction rub.   No murmur heard. Pulmonary/Chest: Effort normal and breath sounds normal. No stridor. No respiratory distress. She has no wheezes. She has no rales. She exhibits no tenderness.  Abdominal: Soft. Normal appearance and bowel sounds are normal. She exhibits no distension, no abdominal bruit, no ascites and no mass. There is no hepatosplenomegaly. There is no tenderness. There is no rigidity, no rebound and no guarding. No hernia.  Genitourinary: Rectum normal, vagina normal and uterus normal. No breast swelling, tenderness, discharge or bleeding. Cervix exhibits no motion tenderness, no discharge and no friability. Right adnexum displays no mass, no tenderness and no fullness. Left adnexum displays no mass, no tenderness and no fullness. No erythema, tenderness or bleeding around the vagina. No vaginal discharge found.  Musculoskeletal: Normal range of motion. She exhibits no edema and no tenderness.  Lymphadenopathy:    She has no cervical adenopathy.  Neurological: She is alert. She has normal reflexes. No cranial nerve deficit. She exhibits normal muscle tone. Coordination normal.  Skin: Skin is warm and dry. No rash noted. She is not diaphoretic. No erythema. No  pallor.  Psychiatric:  She has a normal mood and affect. Her behavior is normal. Judgment and thought content normal.          Assessment & Plan:  Well exam. Refer to GYN. Treat with a Zpack.

## 2012-12-22 ENCOUNTER — Encounter: Payer: Self-pay | Admitting: Family Medicine

## 2012-12-23 NOTE — Progress Notes (Signed)
Quick Note:  I tried to reach pt by phone, no answer. ______

## 2013-01-08 ENCOUNTER — Other Ambulatory Visit: Payer: Self-pay | Admitting: Family Medicine

## 2013-01-09 NOTE — Telephone Encounter (Signed)
Rx last filled 03/11/2012 #90 x 3 rf.  Pt last seen 12/15/12. Pls advise.

## 2013-02-01 ENCOUNTER — Ambulatory Visit (INDEPENDENT_AMBULATORY_CARE_PROVIDER_SITE_OTHER): Payer: BC Managed Care – PPO | Admitting: Family Medicine

## 2013-02-01 ENCOUNTER — Encounter: Payer: Self-pay | Admitting: Family Medicine

## 2013-02-01 VITALS — BP 130/90 | HR 100 | Temp 99.0°F | Wt 198.0 lb

## 2013-02-01 DIAGNOSIS — J019 Acute sinusitis, unspecified: Secondary | ICD-10-CM

## 2013-02-01 MED ORDER — AZITHROMYCIN 250 MG PO TABS: ORAL_TABLET | ORAL | Status: AC

## 2013-02-01 NOTE — Progress Notes (Signed)
  Subjective:    Patient ID: Gabriela Conley, female    DOB: [birth year], 47 y.o.   MRN: [id number]  HPI Here for one week of sinus pressure, PND, and coughing up yellow sputum.    Review of Systems  Constitutional: Negative.   HENT: Positive for congestion, postnasal drip and sinus pressure.   Eyes: Negative.   Respiratory: Positive for cough.        Objective:   Physical Exam  Constitutional: She appears well-developed and well-nourished.  HENT:  Right Ear: External ear normal.  Left Ear: External ear normal.  Nose: Nose normal.  Mouth/Throat: Oropharynx is clear and moist.  Eyes: Conjunctivae are normal.  Pulmonary/Chest: Effort normal and breath sounds normal.  Lymphadenopathy:    She has no cervical adenopathy.          Assessment & Plan:  Add Mucinex

## 2013-02-23 ENCOUNTER — Encounter: Payer: Self-pay | Admitting: Family Medicine

## 2013-02-23 ENCOUNTER — Ambulatory Visit (INDEPENDENT_AMBULATORY_CARE_PROVIDER_SITE_OTHER): Payer: BC Managed Care – PPO | Admitting: Family Medicine

## 2013-02-23 VITALS — BP 120/84 | HR 92 | Temp 98.5°F | Wt 199.0 lb

## 2013-02-23 DIAGNOSIS — R5383 Other fatigue: Secondary | ICD-10-CM

## 2013-02-23 DIAGNOSIS — E162 Hypoglycemia, unspecified: Secondary | ICD-10-CM

## 2013-02-23 DIAGNOSIS — R5381 Other malaise: Secondary | ICD-10-CM

## 2013-02-23 LAB — CBC WITH DIFFERENTIAL/PLATELET
Basophils Relative: 0.5 % (ref 0.0–3.0)
Eosinophils Absolute: 0.3 10*3/uL (ref 0.0–0.7)
Eosinophils Relative: 4.3 % (ref 0.0–5.0)
Lymphocytes Relative: 17.5 % (ref 12.0–46.0)
MCV: 90.2 fl (ref 78.0–100.0)
Monocytes Absolute: 0.8 10*3/uL (ref 0.1–1.0)
Neutrophils Relative %: 67.1 % (ref 43.0–77.0)
Platelets: 209 10*3/uL (ref 150.0–400.0)
RBC: 4.25 Mil/uL (ref 3.87–5.11)
WBC: 7.4 10*3/uL (ref 4.5–10.5)

## 2013-02-23 LAB — BASIC METABOLIC PANEL
Chloride: 102 mEq/L (ref 96–112)
GFR: 96.01 mL/min (ref >60.00)
Glucose, Bld: 98 mg/dL (ref 70–99)
Potassium: 3.8 mEq/L (ref 3.5–5.1)
Sodium: 135 mEq/L (ref 135–145)

## 2013-02-23 LAB — TSH: TSH: 0.25 u[IU]/mL — ABNORMAL LOW (ref 0.35–5.50)

## 2013-02-23 NOTE — Progress Notes (Signed)
  Subjective:    Patient ID: Gabriela Conley, female    DOB: 1966/06/21, 47 y.o.   MRN: [id number]  HPI Here for one week of lightheadedness and fatigue. She has spells of this where she feels very sleepy, and then it passes after an hour or two. She sleeps well at night. No vertigo. No sinus pressure or HA. No nausea. She has a hx of gestational diabetes.    Review of Systems  Constitutional: Positive for fatigue.  HENT: Negative.   Eyes: Negative.   Respiratory: Negative.   Neurological: Positive for light-headedness. Negative for dizziness and headaches.       Objective:   Physical Exam  Constitutional: She is oriented to person, place, and time. She appears well-developed and well-nourished. No distress.  Neck: No thyromegaly present.  Cardiovascular: Normal rate, regular rhythm, normal heart sounds and intact distal pulses.   Pulmonary/Chest: Effort normal and breath sounds normal.  Lymphadenopathy:    She has no cervical adenopathy.  Neurological: She is alert and oriented to person, place, and time.          Assessment & Plan:  Lightheadedness and fatigue. Check labs today and set up a GTT.

## 2013-02-24 LAB — POCT URINALYSIS DIPSTICK
Glucose, UA: NEGATIVE
Nitrite, UA: NEGATIVE
Spec Grav, UA: >=1.03
Urobilinogen, UA: 0.2

## 2013-02-25 ENCOUNTER — Other Ambulatory Visit (INDEPENDENT_AMBULATORY_CARE_PROVIDER_SITE_OTHER): Payer: BC Managed Care – PPO

## 2013-02-25 DIAGNOSIS — E162 Hypoglycemia, unspecified: Secondary | ICD-10-CM

## 2013-02-25 LAB — GLUCOSE TOLERANCE, 3 HOURS
Glucose, 2 hour: 199 mg/dL
Glucose, Fasting: 109 mg/dL — ABNORMAL HIGH (ref 70–99)
Glucose, GTT - 3 Hour: 107 mg/dL

## 2013-02-26 MED ORDER — LEVOTHYROXINE SODIUM 150 MCG PO TABS: 150.0000 ug | ORAL_TABLET | Freq: Every day | ORAL | Status: DC

## 2013-02-26 NOTE — Progress Notes (Signed)
Quick Note:  Left voice message for pt to return my call ______ 

## 2013-02-26 NOTE — Progress Notes (Signed)
Quick Note:  Left voice message for pt to return my call ______

## 2013-02-26 NOTE — Progress Notes (Signed)
Quick Note:  I spoke with pt ______ 

## 2013-02-26 NOTE — Addendum Note (Signed)
Addended by: Aniceto Boss A on: 02/26/2013 09:55 AM   Modules accepted: Orders, Medications

## 2013-02-26 NOTE — Progress Notes (Signed)
Quick Note:  I spoke with pt and sent script e-scribe. ______ 

## 2013-03-23 ENCOUNTER — Telehealth: Payer: Self-pay | Admitting: Family Medicine

## 2013-03-23 MED ORDER — LEVOTHYROXINE SODIUM 150 MCG PO TABS: 150.0000 ug | ORAL_TABLET | Freq: Every day | ORAL | Status: DC

## 2013-03-23 NOTE — Telephone Encounter (Signed)
Refill request for Levothyroxine 150 mcg and send to [facility] 90 day supply, which I did send e-scribe.

## 2013-05-03 ENCOUNTER — Encounter (HOSPITAL_COMMUNITY): Payer: Self-pay | Admitting: *Deleted

## 2013-05-03 ENCOUNTER — Inpatient Hospital Stay (HOSPITAL_COMMUNITY): Payer: BC Managed Care – PPO

## 2013-05-03 ENCOUNTER — Telehealth: Payer: Self-pay | Admitting: Family Medicine

## 2013-05-03 ENCOUNTER — Inpatient Hospital Stay (HOSPITAL_COMMUNITY)
Admission: AD | Admit: 2013-05-03 | Discharge: 2013-05-03 | Disposition: A | Discharge status: Home / Self Care | Payer: BC Managed Care – PPO | Source: Ambulatory Visit | Attending: Obstetrics & Gynecology | Admitting: Obstetrics & Gynecology

## 2013-05-03 DIAGNOSIS — N390 Urinary tract infection, site not specified: Secondary | ICD-10-CM

## 2013-05-03 DIAGNOSIS — N938 Other specified abnormal uterine and vaginal bleeding: Secondary | ICD-10-CM | POA: Insufficient documentation

## 2013-05-03 DIAGNOSIS — N949 Unspecified condition associated with female genital organs and menstrual cycle: Secondary | ICD-10-CM | POA: Insufficient documentation

## 2013-05-03 DIAGNOSIS — N946 Dysmenorrhea, unspecified: Secondary | ICD-10-CM | POA: Insufficient documentation

## 2013-05-03 LAB — URINALYSIS, ROUTINE W REFLEX MICROSCOPIC
Protein, ur: >300 mg/dL — AB
Specific Gravity, Urine: 1.025 (ref 1.005–1.030)
Urobilinogen, UA: 4 mg/dL — ABNORMAL HIGH (ref 0.0–1.0)

## 2013-05-03 LAB — CBC
Hemoglobin: 12.7 g/dL (ref 12.0–15.0)
MCHC: 33.7 g/dL (ref 30.0–36.0)
Platelets: 168 10*3/uL (ref 150–400)
RDW: 13.1 % (ref 11.5–15.5)

## 2013-05-03 LAB — POCT PREGNANCY, URINE: Preg Test, Ur: NEGATIVE

## 2013-05-03 LAB — URINE MICROSCOPIC-ADD ON

## 2013-05-03 LAB — WET PREP, GENITAL
Trich, Wet Prep: NONE SEEN
Yeast Wet Prep HPF POC: NONE SEEN

## 2013-05-03 MED ORDER — SULFAMETHOXAZOLE-TMP DS 800-160 MG PO TABS: 1.0000 | ORAL_TABLET | Freq: Two times a day (BID) | ORAL | Status: DC

## 2013-05-03 MED ORDER — IBUPROFEN 800 MG PO TABS: 800.0000 mg | ORAL_TABLET | Freq: Three times a day (TID) | ORAL | Status: DC | PRN

## 2013-05-03 MED ORDER — KETOROLAC TROMETHAMINE 60 MG/2ML IM SOLN
60.0000 mg | Freq: Once | INTRAMUSCULAR | Status: AC
  Administered 2013-05-03: 60 mg via INTRAMUSCULAR
  Filled 2013-05-03: qty 2

## 2013-05-03 NOTE — Telephone Encounter (Signed)
Patient Information:  Caller Name: Zetta  Phone: [phone]  Patient: Gabriela Conley, Gabriela Conley  Gender: Female  DOB: Aug 09, 1966  Age: 47 Years  PCP: G[facility])  Pregnant: No  Office Follow Up:  Does the office need to follow up with this patient?: Yes  Instructions For The Office: Dr Clent Ridges has no same day appt , Pt would rather come to office than ED.  RN Note:  Pt feels lightheaded, menstrual is not due for another week, Pt is having to change pad every hr. Pt has ovarian pain. Pt has blood clots, palm of hand size. Pt would rather be seen in office rather than ED.  Symptoms  Reason For Call & Symptoms: ER CALL. Vaginal Bleeding  Reviewed Health History In EMR: N/A  Reviewed Medications In EMR: N/A  Reviewed Allergies In EMR: N/A  Reviewed Surgeries / Procedures: N/A  Date of Onset of Symptoms: 05/02/2013 OB / GYN:  LMP: 05/03/2013  Guideline(s) Used:  Vaginal Bleeding - Abnormal  Disposition Per Guideline:   Go to ED Now (or to Office with PCP Approval)  Reason For Disposition Reached:   Constant abdominal pain lasting > 2 hours  Advice Given:  N/A  Patient Refused Recommendation:  Patient Will Make Own Appointment  PT would like office appt vrs ED visist.

## 2013-05-03 NOTE — MAU Provider Note (Signed)
History     CSN: [id number]  Arrival date and time: 05/03/13 1254   First Provider Initiated Contact with Patient 05/03/13 1403      Chief Complaint  Patient presents with  . Vaginal Bleeding  46 y.o. [id number] with vaginal bleeding. States her period started 9 days earlier than usual and is heavier and more painful than usual. Had painful/heavy periods prior to her BTL in 2005, since then, they have been regular and not problematic. Pain is right sided.  Vaginal Bleeding The patient's primary symptoms include vaginal bleeding. This is a new problem. The current episode started yesterday. The problem occurs constantly. The problem has been gradually improving. The pain is severe. The problem affects both sides. She is not pregnant. Associated symptoms include abdominal pain and nausea. Pertinent negatives include no vomiting. She has been passing clots. She has not been passing tissue. She has tried acetaminophen for the symptoms. The treatment provided no relief. She uses nothing for contraception. Her menstrual history has been regular.     Past Medical History  Diagnosis Date  . Asthma   . Allergy   . Hypothyroid   . GERD (gastroesophageal reflux disease)   . Anxiety   . Diabetes mellitus without complication     gestational     Past Surgical History  Procedure Laterality Date  . Cystectomy      removed from chest  . Breast surgery      removal of benign cyst   . Hernia repair      umbilical   . Tubal ligation      Family History  Problem Relation Age of Onset  . Uterine cancer Mother   . Emphysema Mother   . Stomach cancer Father     History  Substance Use Topics  . Smoking status: Former Games developer  . Smokeless tobacco: Never Used  . Alcohol Use: Yes     Comment: rare    Allergies:  Allergies  Allergen Reactions  . Chocolate Anaphylaxis    Prescriptions prior to admission  Medication Sig Dispense Refill  . fish oil-omega-3 fatty acids 1000 MG capsule  Take 2 g by mouth daily.      . IRON PO Take 1 tablet by mouth daily.      Marland Kitchen levothyroxine (SYNTHROID, LEVOTHROID) 150 MCG tablet Take 1 tablet (150 mcg total) by mouth daily.  90 tablet  2  . omeprazole (PRILOSEC) 20 MG capsule Take 1 capsule (20 mg total) by mouth daily.  90 capsule  3  . venlafaxine XR (EFFEXOR-XR) 37.5 MG 24 hr capsule TAKE 1 CAPSULE DAILY  90 capsule  2    Review of Systems  Constitutional: Negative.   Respiratory: Negative.   Cardiovascular: Negative.   Gastrointestinal: Positive for nausea and abdominal pain. Negative for vomiting.  Genitourinary: Positive for vaginal bleeding.   Physical Exam   Blood pressure 128/81, pulse 73, temperature 98.4 F (36.9 C), temperature source Oral, resp. rate 16, height 5\' 3"  (1.6 m), weight 201 lb 12.8 oz (91.536 kg), last menstrual period 05/02/2013, SpO2 98.00%.  Physical Exam  Nursing note and vitals reviewed. Constitutional: She is oriented to person, place, and time. She appears well-developed and well-nourished.  Cardiovascular: Normal rate.   Respiratory: Effort normal.  Genitourinary: Uterus is tender. Cervix exhibits no motion tenderness and no discharge. Right adnexum displays tenderness. Right adnexum displays no mass and no fullness. Left adnexum displays tenderness. Left adnexum displays no mass and no fullness. There is tenderness and bleeding  around the vagina. No erythema around the vagina. No vaginal discharge found.  Exam limited by body habitus.  Neurological: She is alert and oriented to person, place, and time.  Skin: Skin is warm and dry.  Psychiatric: She has a normal mood and affect.    MAU Course  Procedures  Results for orders placed during the hospital encounter of 05/03/13 (from the past 24 hour(s))  URINALYSIS, ROUTINE W REFLEX MICROSCOPIC     Status: Abnormal   Collection Time    05/03/13  1:26 PM      Result Value Range   Color, Urine RED (*) YELLOW   APPearance TURBID (*) CLEAR    Specific Gravity, Urine 1.025  1.005 - 1.030   pH 6.5  5.0 - 8.0   Glucose, UA 100 (*) NEGATIVE mg/dL   Hgb urine dipstick LARGE (*) NEGATIVE   Bilirubin Urine NEGATIVE  NEGATIVE   Ketones, ur 15 (*) NEGATIVE mg/dL   Protein, ur >161 (*) NEGATIVE mg/dL   Urobilinogen, UA 4.0 (*) 0.0 - 1.0 mg/dL   Nitrite POSITIVE (*) NEGATIVE   Leukocytes, UA MODERATE (*) NEGATIVE  URINE MICROSCOPIC-ADD ON     Status: Abnormal   Collection Time    05/03/13  1:26 PM      Result Value Range   Squamous Epithelial / LPF RARE  RARE   WBC, UA 3-6  <3 WBC/hpf   RBC / HPF TOO NUMEROUS TO COUNT  <3 RBC/hpf   Bacteria, UA FEW (*) RARE  POCT PREGNANCY, URINE     Status: None   Collection Time    05/03/13  1:30 PM      Result Value Range   Preg Test, Ur NEGATIVE  NEGATIVE  CBC     Status: None   Collection Time    05/03/13  2:10 PM      Result Value Range   WBC 5.6  4.0 - 10.5 K/uL   RBC 4.19  3.87 - 5.11 MIL/uL   Hemoglobin 12.7  12.0 - 15.0 g/dL   HCT 09.6  04.5 - 40.9 %   MCV 90.0  78.0 - 100.0 fL   MCH 30.3  26.0 - 34.0 pg   MCHC 33.7  30.0 - 36.0 g/dL   RDW 81.1  91.4 - 78.2 %   Platelets 168  150 - 400 K/uL  WET PREP, GENITAL     Status: Abnormal   Collection Time    05/03/13  2:10 PM      Result Value Range   Yeast Wet Prep HPF POC NONE SEEN  NONE SEEN   Trich, Wet Prep NONE SEEN  NONE SEEN   Clue Cells Wet Prep HPF POC NONE SEEN  NONE SEEN   WBC, Wet Prep HPF POC RARE (*) NONE SEEN   US Transvaginal Non-ob  05/03/2013   *RADIOLOGY REPORT*  Clinical Data: Vaginal bleeding, pelvic pain  TRANSABDOMINAL AND TRANSVAGINAL ULTRASOUND OF PELVIS  Technique:  Both transabdominal and transvaginal ultrasound examinations of the pelvis were performed.  Transabdominal technique was performed for global imaging of the pelvis including uterus, ovaries, adnexal regions, and pelvic cul-de-sac.  It was necessary to proceed with endovaginal exam following the transabdominal exam to visualize the endometrium and  ovaries.  Comparison:  None.  Findings: Uterus:  10.5 x 6.0 x 5.1 cm.  Anteverted, anteflexed.  No focal abnormality.  Endometrium: 4 mm.  Uniformly thin and echogenic without focal abnormality.  Right ovary: 1.8 x 1.8 x 1.3 cm.  Normal.  Left ovary: 2.9 x 2.0 x 3.0 cm.  Normal.  Other Findings:  No free fluid  IMPRESSION: Normal study.  No evidence of pelvic mass or other significant abnormality.   Original Report Authenticated By: Christiana Pellant, M.D.   US Pelvis Complete  05/03/2013   *RADIOLOGY REPORT*  Clinical Data: Vaginal bleeding, pelvic pain  TRANSABDOMINAL AND TRANSVAGINAL ULTRASOUND OF PELVIS  Technique:  Both transabdominal and transvaginal ultrasound examinations of the pelvis were performed.  Transabdominal technique was performed for global imaging of the pelvis including uterus, ovaries, adnexal regions, and pelvic cul-de-sac.  It was necessary to proceed with endovaginal exam following the transabdominal exam to visualize the endometrium and ovaries.  Comparison:  None.  Findings: Uterus:  10.5 x 6.0 x 5.1 cm.  Anteverted, anteflexed.  No focal abnormality.  Endometrium: 4 mm.  Uniformly thin and echogenic without focal abnormality.  Right ovary: 1.8 x 1.8 x 1.3 cm.  Normal.  Left ovary: 2.9 x 2.0 x 3.0 cm.  Normal.  Other Findings:  No free fluid  IMPRESSION: Normal study.  No evidence of pelvic mass or other significant abnormality.   Original Report Authenticated By: Christiana Pellant, M.D.    Assessment and Plan   1. UTI (urinary tract infection)   2. Dysmenorrhea   Bactrim for UTI Motrin for pain F/U with outpatient GYN - list of providers given    Medication List         fish oil-omega-3 fatty acids 1000 MG capsule  Take 2 g by mouth daily.     ibuprofen 800 MG tablet  Commonly known as:  ADVIL,MOTRIN  Take 1 tablet (800 mg total) by mouth every 8 (eight) hours as needed for pain.     IRON PO  Take 1 tablet by mouth daily.     levothyroxine 150 MCG tablet  Commonly  known as:  SYNTHROID, LEVOTHROID  Take 1 tablet (150 mcg total) by mouth daily.     omeprazole 20 MG capsule  Commonly known as:  PRILOSEC  Take 1 capsule (20 mg total) by mouth daily.     sulfamethoxazole-trimethoprim 800-160 MG per tablet  Commonly known as:  BACTRIM DS  Take 1 tablet by mouth 2 (two) times daily.     venlafaxine XR 37.5 MG 24 hr capsule  Commonly known as:  EFFEXOR-XR  TAKE 1 CAPSULE DAILY            Follow-up Information   Follow up with provider of your choice. (for ongoing ob/gyn care)         Daryan Buell 05/03/2013, 4:10 PM

## 2013-05-03 NOTE — MAU Note (Signed)
Patient states she started her period yesterday and is heavier than usual and is 9 days early. States she feels nauseated, lightheaded and having abdominal pain.

## 2013-05-03 NOTE — Telephone Encounter (Signed)
I spoke with pt and she did agree to go to [place].

## 2013-05-03 NOTE — Telephone Encounter (Signed)
There is nothing that we can do for her in our clinic with this sort of problem. I suggest she go to [facility] where she could be evaluated and treated fairly quickly.

## 2013-05-04 LAB — URINE CULTURE: Colony Count: 8000

## 2013-05-06 NOTE — MAU Provider Note (Signed)
Attestation of Attending Supervision of Advanced Practitioner (CNM/NP): Evaluation and management procedures were performed by the Advanced Practitioner under my supervision and collaboration. I have reviewed the Advanced Practitioner's note and chart, and I agree with the management and plan.  Faryn Sieg H. 10:06 PM   

## 2013-05-18 ENCOUNTER — Ambulatory Visit (INDEPENDENT_AMBULATORY_CARE_PROVIDER_SITE_OTHER): Payer: BC Managed Care – PPO | Admitting: Internal Medicine

## 2013-05-18 ENCOUNTER — Encounter: Payer: Self-pay | Admitting: Internal Medicine

## 2013-05-18 VITALS — BP 102/72 | HR 77 | Temp 98.0°F | Ht 63.0 in | Wt 201.4 lb

## 2013-05-18 DIAGNOSIS — M549 Dorsalgia, unspecified: Secondary | ICD-10-CM

## 2013-05-18 DIAGNOSIS — R1031 Right lower quadrant pain: Secondary | ICD-10-CM

## 2013-05-18 LAB — POCT URINALYSIS DIPSTICK
Bilirubin, UA: NEGATIVE
Blood, UA: NEGATIVE
Glucose, UA: NEGATIVE
Ketones, UA: NEGATIVE
Spec Grav, UA: >=1.03

## 2013-05-18 MED ORDER — TRAMADOL HCL 50 MG PO TABS: 50.0000 mg | ORAL_TABLET | Freq: Three times a day (TID) | ORAL | Status: DC | PRN

## 2013-05-18 NOTE — Progress Notes (Signed)
HPI  Pt presents to the clinic today with c/o RLQ pain and right flank pain. This started 3 days ago. She was recently treated for a UTI on 6/30. She was given Septra which did help. She does report the pain she is having now does feel the same as the pain she had when she had the UTI. She has no history of kidney stones. The pain is 8/10 and does radiate to the bladder. She has taken ibuprofen but this has not helped. She did have ultrasound pelvis which was normal as well. She denies nausea and vomiting. She denies any blood in her urine. She denies fever, chills or body aches.   Review of Systems  Past Medical History  Diagnosis Date  . Asthma   . Allergy   . Hypothyroid   . GERD (gastroesophageal reflux disease)   . Anxiety   . Diabetes mellitus without complication     gestational     Family History  Problem Relation Age of Onset  . Uterine cancer Mother   . Emphysema Mother   . Stomach cancer Father     History   Social History  . Marital Status: Single    Spouse Name: N/A    Number of Children: N/A  . Years of Education: N/A   Occupational History  . Not on file.   Social History Main Topics  . Smoking status: Former Games developer  . Smokeless tobacco: Never Used  . Alcohol Use: Yes     Comment: rare  . Drug Use: No  . Sexually Active: Not on file   Other Topics Concern  . Not on file   Social History Narrative  . No narrative on file    Allergies  Allergen Reactions  . Chocolate Anaphylaxis    Constitutional: Denies fever, malaise, fatigue, headache or abrupt weight changes.   GU: Pt reports RLQ pain. Denies urgency, frequency, burning sensation, blood in urine, odor or discharge. Skin: Denies redness, rashes, lesions or ulcercations.   No other specific complaints in a complete review of systems (except as listed in HPI above).    Objective:   Physical Exam  BP 102/72  Pulse 77  Temp(Src) 98 F (36.7 C) (Oral)  Ht 5\' 3"  (1.6 m)  Wt 201 lb 6.4  oz (91.354 kg)  BMI 35.69 kg/m2  SpO2 97%  LMP 05/02/2013 Wt Readings from Last 3 Encounters:  05/18/13 201 lb 6.4 oz (91.354 kg)  05/03/13 201 lb 12.8 oz (91.536 kg)  02/23/13 199 lb (90.266 kg)    General: Appears her stated age, well developed, well nourished in NAD. Cardiovascular: Normal rate and rhythm. S1,S2 noted.  No murmur, rubs or gallops noted. No JVD or BLE edema. No carotid bruits noted. Pulmonary/Chest: Normal effort and positive vesicular breath sounds. No respiratory distress. No wheezes, rales or ronchi noted.  Abdomen: Soft and tender in the RLQ. Normal bowel sounds, no bruits noted. No distention or masses noted. Liver, spleen and kidneys non palpable. Tender to palpation over the bladder area. No CVA tenderness.      Assessment & Plan:   Flank pain and RLQ pain, recurrent:  Will check urinalysis-negative Will check CT abdomen to r/o kidney stone eRx for tramadol Drink plenty of fluids  RTC as needed or if symptoms persist.

## 2013-05-18 NOTE — Patient Instructions (Signed)

## 2013-05-21 ENCOUNTER — Ambulatory Visit (INDEPENDENT_AMBULATORY_CARE_PROVIDER_SITE_OTHER)
Admission: RE | Admit: 2013-05-21 | Discharge: 2013-05-21 | Disposition: A | Discharge status: Home / Self Care | Payer: BC Managed Care – PPO | Source: Ambulatory Visit | Attending: Internal Medicine | Admitting: Internal Medicine

## 2013-05-21 DIAGNOSIS — R1031 Right lower quadrant pain: Secondary | ICD-10-CM

## 2013-05-24 ENCOUNTER — Telehealth: Payer: Self-pay | Admitting: *Deleted

## 2013-05-24 NOTE — Telephone Encounter (Signed)
Pt called requesting Ct results.  Results given as per Reginas result note.

## 2013-06-10 ENCOUNTER — Other Ambulatory Visit: Payer: Self-pay | Admitting: Obstetrics and Gynecology

## 2013-06-10 DIAGNOSIS — Z1231 Encounter for screening mammogram for malignant neoplasm of breast: Secondary | ICD-10-CM

## 2013-06-16 ENCOUNTER — Other Ambulatory Visit (INDEPENDENT_AMBULATORY_CARE_PROVIDER_SITE_OTHER): Payer: BC Managed Care – PPO

## 2013-06-16 ENCOUNTER — Ambulatory Visit (INDEPENDENT_AMBULATORY_CARE_PROVIDER_SITE_OTHER): Payer: BC Managed Care – PPO | Admitting: Internal Medicine

## 2013-06-16 ENCOUNTER — Encounter: Payer: Self-pay | Admitting: Internal Medicine

## 2013-06-16 VITALS — BP 110/76 | HR 86 | Temp 98.5°F | Wt 199.1 lb

## 2013-06-16 DIAGNOSIS — J069 Acute upper respiratory infection, unspecified: Secondary | ICD-10-CM

## 2013-06-16 DIAGNOSIS — E039 Hypothyroidism, unspecified: Secondary | ICD-10-CM

## 2013-06-16 DIAGNOSIS — J019 Acute sinusitis, unspecified: Secondary | ICD-10-CM

## 2013-06-16 DIAGNOSIS — J309 Allergic rhinitis, unspecified: Secondary | ICD-10-CM

## 2013-06-16 LAB — TSH: TSH: 2.55 u[IU]/mL (ref 0.35–5.50)

## 2013-06-16 MED ORDER — AMOXICILLIN-POT CLAVULANATE 875-125 MG PO TABS: 1.0000 | ORAL_TABLET | Freq: Two times a day (BID) | ORAL | Status: DC

## 2013-06-16 NOTE — Progress Notes (Signed)
HPI  Pt presents to the clinic today of runny nose, nasal congestion, sinus pain and pressure, headache, fatigue and cough. She denies fever or chills. This started 1 week ago. She took Mucinex and tylenol sinus which did not seem to help. She does have a history of allergy and asthma. She is not taking anything from allergies. She has not had sick contacts. She has had about 2 sinus infections this year already. She does not smoke.  Additionally, she did have a dose adjustment on her thyroid medication back in April. She was supposed to have her TSH rechecked in July. She would like to get it done today while she was here.  Review of Systems    Past Medical History  Diagnosis Date  . Asthma   . Allergy   . Hypothyroid   . GERD (gastroesophageal reflux disease)   . Anxiety   . Diabetes mellitus without complication     gestational     Family History  Problem Relation Age of Onset  . Uterine cancer Mother   . Emphysema Mother   . Stomach cancer Father     History   Social History  . Marital Status: Single    Spouse Name: N/A    Number of Children: N/A  . Years of Education: N/A   Occupational History  . Not on file.   Social History Main Topics  . Smoking status: Former Games developer  . Smokeless tobacco: Never Used  . Alcohol Use: Yes     Comment: rare  . Drug Use: No  . Sexual Activity: Not on file   Other Topics Concern  . Not on file   Social History Narrative  . No narrative on file    Allergies  Allergen Reactions  . Chocolate Anaphylaxis     Constitutional: Positive headache, fatigue. Denies fever or  abrupt weight changes.  HEENT:  Positive eye pain, pressure behind the eyes, facial pain, nasal congestion and sore throat. Denies eye redness, ear pain, ringing in the ears, wax buildup, runny nose or bloody nose. Respiratory: Positive cough. Denies difficulty breathing or shortness of breath.  Cardiovascular: Denies chest pain, chest tightness, palpitations  or swelling in the hands or feet.   No other specific complaints in a complete review of systems (except as listed in HPI above).  Objective:    BP 110/76  Pulse 86  Temp(Src) 98.5 F (36.9 C) (Oral)  Wt 199 lb 1.9 oz (90.32 kg)  BMI 35.28 kg/m2  SpO2 97%  LMP 05/04/2013 Wt Readings from Last 3 Encounters:  06/16/13 199 lb 1.9 oz (90.32 kg)  05/18/13 201 lb 6.4 oz (91.354 kg)  05/03/13 201 lb 12.8 oz (91.536 kg)    General: Appears her stated age, overweight but well developed, well nourished in NAD. HEENT: Head: normal shape and size, maxillary sinus tender to palpation; Eyes: sclera white, no icterus, conjunctiva pink, PERRLA and EOMs intact; Ears: Tm's erythematous but intact, normal light reflex; Nose: mucosa boggy and moist, septum midline; Throat/Mouth: + PND. Teeth present, mucosa pink and moist, no exudate noted, no lesions or ulcerations noted.  Neck: Mild tonsillar lymphadenopathy. Neck supple, trachea midline. No massses, lumps or thyromegaly present.  Cardiovascular: Normal rate and rhythm. S1,S2 noted.  No murmur, rubs or gallops noted. No JVD or BLE edema. No carotid bruits noted. Pulmonary/Chest: Normal effort and fine rhonchi in upper lobes. No respiratory distress. No wheezes, rales or ronchi noted.      Assessment & Plan:  Acute bacterial sinusitis, likely secondary to allergies combined with URI  Can use a Neti which can be purchased from your local drug store Will give RX for Augmentin Encourage pt to take Antihistamine OTC  Hypothyroidism, chronic:  Will check TSH  RTC as needed or if symptoms persist.

## 2013-06-16 NOTE — Patient Instructions (Signed)

## 2013-07-02 ENCOUNTER — Ambulatory Visit
Admission: RE | Admit: 2013-07-02 | Discharge: 2013-07-02 | Disposition: A | Discharge status: Home / Self Care | Payer: BC Managed Care – PPO | Source: Ambulatory Visit | Attending: Obstetrics and Gynecology | Admitting: Obstetrics and Gynecology

## 2013-07-02 DIAGNOSIS — Z1231 Encounter for screening mammogram for malignant neoplasm of breast: Secondary | ICD-10-CM

## 2013-07-07 ENCOUNTER — Other Ambulatory Visit: Payer: Self-pay | Admitting: Obstetrics and Gynecology

## 2013-07-07 DIAGNOSIS — N6310 Unspecified lump in the right breast, unspecified quadrant: Secondary | ICD-10-CM

## 2013-07-07 DIAGNOSIS — R928 Other abnormal and inconclusive findings on diagnostic imaging of breast: Secondary | ICD-10-CM

## 2013-07-28 ENCOUNTER — Ambulatory Visit
Admission: RE | Admit: 2013-07-28 | Discharge: 2013-07-28 | Disposition: A | Discharge status: Home / Self Care | Payer: BC Managed Care – PPO | Source: Ambulatory Visit | Attending: Obstetrics and Gynecology | Admitting: Obstetrics and Gynecology

## 2013-07-28 DIAGNOSIS — N6310 Unspecified lump in the right breast, unspecified quadrant: Secondary | ICD-10-CM

## 2013-07-28 DIAGNOSIS — R928 Other abnormal and inconclusive findings on diagnostic imaging of breast: Secondary | ICD-10-CM

## 2013-08-02 ENCOUNTER — Other Ambulatory Visit: Payer: Self-pay | Admitting: Family Medicine

## 2013-08-05 ENCOUNTER — Telehealth: Payer: Self-pay | Admitting: Family Medicine

## 2013-08-05 NOTE — Telephone Encounter (Signed)
Pt needs refill on generic flexeril and diazepam sent to [facility] on [address].

## 2013-08-06 NOTE — Telephone Encounter (Signed)
She needs an OV for this  

## 2013-08-06 NOTE — Telephone Encounter (Signed)
I left voice message with below information. 

## 2013-08-10 ENCOUNTER — Ambulatory Visit (INDEPENDENT_AMBULATORY_CARE_PROVIDER_SITE_OTHER): Payer: BC Managed Care – PPO | Admitting: Family Medicine

## 2013-08-10 ENCOUNTER — Encounter: Payer: Self-pay | Admitting: Family Medicine

## 2013-08-10 VITALS — BP 126/60 | HR 84 | Temp 98.4°F | Wt 205.0 lb

## 2013-08-10 DIAGNOSIS — Z23 Encounter for immunization: Secondary | ICD-10-CM

## 2013-08-10 DIAGNOSIS — F419 Anxiety disorder, unspecified: Secondary | ICD-10-CM

## 2013-08-10 DIAGNOSIS — F411 Generalized anxiety disorder: Secondary | ICD-10-CM

## 2013-08-10 MED ORDER — VENLAFAXINE HCL ER 75 MG PO CP24: 75.0000 mg | ORAL_CAPSULE | Freq: Every day | ORAL | Status: DC

## 2013-08-10 MED ORDER — DIAZEPAM 5 MG PO TABS: 5.0000 mg | ORAL_TABLET | Freq: Two times a day (BID) | ORAL | Status: DC | PRN

## 2013-08-10 NOTE — Progress Notes (Signed)
  Subjective:    Patient ID: Gabriela Conley, female    DOB: 16-Feb-1966, 47 y.o.   MRN: [id number]  HPI Here for several things. First she has been having some panic attacks lately although she is not aware of feeling stressed. She admits that she has a lot more demands on her job than before and she is working longer hours. She sleeps well but not enough hours. She has been on a very low dose of Effexor for years now.    Review of Systems  Constitutional: Negative.   Neurological: Negative.   Psychiatric/Behavioral: Positive for agitation. Negative for hallucinations, confusion, dysphoric mood and decreased concentration. The patient is nervous/anxious.        Objective:   Physical Exam  Constitutional: She is oriented to person, place, and time. She appears well-developed and well-nourished.  Neurological: She is alert and oriented to person, place, and time.  Psychiatric: She has a normal mood and affect. Her behavior is normal. Thought content normal.          Assessment & Plan:  We will increase the Effexor to 75 mg daily. Add Valium for prn use. Get more exercise.

## 2013-08-18 ENCOUNTER — Other Ambulatory Visit (HOSPITAL_COMMUNITY): Payer: Self-pay | Admitting: Advanced Practice Midwife

## 2013-09-06 ENCOUNTER — Other Ambulatory Visit: Payer: Self-pay | Admitting: Family Medicine

## 2013-10-03 ENCOUNTER — Other Ambulatory Visit: Payer: Self-pay | Admitting: Family Medicine

## 2013-11-11 ENCOUNTER — Ambulatory Visit (INDEPENDENT_AMBULATORY_CARE_PROVIDER_SITE_OTHER): Payer: BC Managed Care – PPO | Admitting: Family Medicine

## 2013-11-11 ENCOUNTER — Encounter: Payer: Self-pay | Admitting: Family Medicine

## 2013-11-11 VITALS — BP 138/72 | HR 93 | Temp 99.3°F | Wt 209.0 lb

## 2013-11-11 DIAGNOSIS — K625 Hemorrhage of anus and rectum: Secondary | ICD-10-CM

## 2013-11-11 DIAGNOSIS — R109 Unspecified abdominal pain: Secondary | ICD-10-CM

## 2013-11-11 DIAGNOSIS — E039 Hypothyroidism, unspecified: Secondary | ICD-10-CM

## 2013-11-11 LAB — CBC WITH DIFFERENTIAL/PLATELET
BASOS ABS: 0 10*3/uL (ref 0.0–0.1)
BASOS PCT: 0.4 % (ref 0.0–3.0)
EOS ABS: 0.5 10*3/uL (ref 0.0–0.7)
Eosinophils Relative: 4.8 % (ref 0.0–5.0)
HCT: 41.1 % (ref 36.0–46.0)
Hemoglobin: 13.8 g/dL (ref 12.0–15.0)
Lymphocytes Relative: 36 % (ref 12.0–46.0)
Lymphs Abs: 3.8 10*3/uL (ref 0.7–4.0)
MCHC: 33.6 g/dL (ref 30.0–36.0)
MCV: 90.9 fl (ref 78.0–100.0)
MONO ABS: 1.1 10*3/uL — AB (ref 0.1–1.0)
Monocytes Relative: 10.5 % (ref 3.0–12.0)
NEUTROS PCT: 48.3 % (ref 43.0–77.0)
Neutro Abs: 5 10*3/uL (ref 1.4–7.7)
PLATELETS: 194 10*3/uL (ref 150.0–400.0)
RBC: 4.52 Mil/uL (ref 3.87–5.11)
RDW: 13.9 % (ref 11.5–14.6)
WBC: 10.4 10*3/uL (ref 4.5–10.5)

## 2013-11-11 LAB — TSH: TSH: 0.32 u[IU]/mL — ABNORMAL LOW (ref 0.35–5.50)

## 2013-11-11 MED ORDER — OMEPRAZOLE 20 MG PO CPDR: 20.0000 mg | DELAYED_RELEASE_CAPSULE | Freq: Every day | ORAL | Status: DC

## 2013-11-11 NOTE — Progress Notes (Signed)
Pre visit review using our clinic review tool, if applicable. No additional management support is needed unless otherwise documented below in the visit note. 

## 2013-11-12 ENCOUNTER — Encounter: Payer: Self-pay | Admitting: Family Medicine

## 2013-11-12 NOTE — Progress Notes (Signed)
   Subjective:    Patient ID: Gabriela Conley, female    DOB: Apr 02, 1966, 48 y.o.   MRN: [id number]  HPI Here for 2 days of mild lower abdominal cramps, some frank blood in the stool, loose stools, and excess flatus. No nausea or fever. She has had intermittent abdominal cramps for several months. She asks to check her iron and thyroid levels today.    Review of Systems  Constitutional: Negative.   Respiratory: Negative.   Cardiovascular: Negative.   Gastrointestinal: Positive for abdominal pain, diarrhea and blood in stool. Negative for nausea, vomiting, constipation, abdominal distention, anal bleeding and rectal pain.  Genitourinary: Negative.        Objective:   Physical Exam  Constitutional: She appears well-developed and well-nourished. No distress.  Neck: No thyromegaly present.  Cardiovascular: Normal rate, regular rhythm, normal heart sounds and intact distal pulses.   Pulmonary/Chest: Effort normal and breath sounds normal.  Abdominal: Soft. Bowel sounds are normal. She exhibits no distension and no mass. There is no rebound and no guarding.  Mildly tender in both lower quadrants  Lymphadenopathy:    She has no cervical adenopathy.          Assessment & Plan:  Several etiologies come to mind, including diverticular disease or inflammatory bowel disease. We will refer her to GI for possible colonoscopy. Get a CBC and TSH today.

## 2013-11-15 ENCOUNTER — Ambulatory Visit (INDEPENDENT_AMBULATORY_CARE_PROVIDER_SITE_OTHER): Payer: BC Managed Care – PPO | Admitting: Family Medicine

## 2013-11-15 ENCOUNTER — Encounter: Payer: Self-pay | Admitting: Family Medicine

## 2013-11-15 ENCOUNTER — Telehealth: Payer: Self-pay | Admitting: Family Medicine

## 2013-11-15 VITALS — BP 128/80 | HR 125 | Temp 98.4°F | Wt 210.0 lb

## 2013-11-15 DIAGNOSIS — M549 Dorsalgia, unspecified: Secondary | ICD-10-CM

## 2013-11-15 MED ORDER — TRAMADOL HCL 50 MG PO TABS: 50.0000 mg | ORAL_TABLET | Freq: Four times a day (QID) | ORAL | Status: DC | PRN

## 2013-11-15 NOTE — Progress Notes (Signed)
   Subjective:    Patient ID: Gabriela Conley, female    DOB: Feb 14, 1966, 48 y.o.   MRN: [id number]  HPI  Acute visit for right thoracic back pain. Onset today. No injury. She describes pain as sharp and relatively constant. Pain is very odd distribution with midthoracic and radiates up somewhat toward the right axilla but also occasionally toward the right lumbar region. No radiation to the left side. No exacerbating factors. No alleviating factors. She denies any associated cough, pleuritic pain, skin rash, or fever. Pain distribution does not correlate with any dermatome distribution. No abdominal pain  She's had separate issue of some frequent stools and occasional bloody stools recently. GI referral has been made. Recent hemoglobin normal. no bloody stools today. No current abdominal pain. No fevers or chills.  Past Medical History  Diagnosis Date  . Asthma   . Allergy   . Hypothyroid   . GERD (gastroesophageal reflux disease)   . Anxiety   . Diabetes mellitus without complication     gestational    Past Surgical History  Procedure Laterality Date  . Cystectomy      removed from chest  . Breast surgery      removal of benign cyst   . Hernia repair      umbilical   . Tubal ligation      reports that she has quit smoking. She has never used smokeless tobacco. She reports that she drinks alcohol. She reports that she does not use illicit drugs. family history includes Emphysema in her mother; Stomach cancer in her father; Uterine cancer in her mother. Allergies  Allergen Reactions  . Chocolate Anaphylaxis     Review of Systems  Constitutional: Negative for fever and chills.  Skin: Negative for rash.       Objective:   Physical Exam  Constitutional: She appears well-developed and well-nourished.  Cardiovascular: Normal rate.   Pulmonary/Chest: Effort normal and breath sounds normal. No respiratory distress. She has no wheezes. She has no rales.  Abdominal: Soft. There is  no tenderness.  Musculoskeletal:   Patient has nonspecific tenderness mid thoracic back region. No skin rashes noted. No spinal tenderness.          Assessment & Plan:  Right mid thoracic back pain. Suspect musculoskeletal. She has reproducible tenderness to palpation. No skin rash. Not following dermatome distribution. Refill Ultram 50 mg one to 2 every 6 hours as needed. Followup promptly for a rash or worsening pain

## 2013-11-15 NOTE — Progress Notes (Signed)
Pre visit review using our clinic review tool, if applicable. No additional management support is needed unless otherwise documented below in the visit note. 

## 2013-11-15 NOTE — Telephone Encounter (Signed)
Patient Information:  Caller Name: Qunisha  Phone: [phone]  Patient: Gabriela Conley  Gender: Female  DOB: 18-Apr-1966  Age: 48 Years  PCP: Alysia Penna [facility])  Pregnant: No  Office Follow Up:  Does the office need to follow up with this patient?: No  Instructions For The Office: N/A   Symptoms  Reason For Call & Symptoms: Pt states she has diarrhea  x 6-8/24 hrs, blood in the stool and upper right side back pain.  Reviewed Health History In EMR: Yes  Reviewed Medications In EMR: Yes  Reviewed Allergies In EMR: Yes  Reviewed Surgeries / Procedures: Yes  Date of Onset of Symptoms: 11/15/2013 OB / GYN:  LMP: 11/15/2013  Guideline(s) Used:  Back Pain  Disposition Per Guideline:   See Today in Office  Reason For Disposition Reached:   Can't walk or can barely walk  Advice Given:  Call Back If:  You become worse.  Patient Will Follow Care Advice:  YES  Appointment Scheduled:  11/15/2013 14:45:00 Appointment Scheduled Provider:  Carolann Littler [facility])

## 2013-11-15 NOTE — Patient Instructions (Signed)
Try moist heat Follow up for any rashes or worsening pain.

## 2013-12-01 ENCOUNTER — Encounter: Payer: Self-pay | Admitting: Family Medicine

## 2013-12-01 ENCOUNTER — Ambulatory Visit (INDEPENDENT_AMBULATORY_CARE_PROVIDER_SITE_OTHER): Payer: BC Managed Care – PPO | Admitting: Family Medicine

## 2013-12-01 VITALS — BP 142/90 | HR 93 | Temp 98.6°F | Ht 63.0 in | Wt 210.0 lb

## 2013-12-01 DIAGNOSIS — J019 Acute sinusitis, unspecified: Secondary | ICD-10-CM

## 2013-12-01 MED ORDER — AZITHROMYCIN 250 MG PO TABS: ORAL_TABLET | ORAL | Status: DC

## 2013-12-01 NOTE — Progress Notes (Signed)
Pre visit review using our clinic review tool, if applicable. No additional management support is needed unless otherwise documented below in the visit note. 

## 2013-12-01 NOTE — Progress Notes (Signed)
   Subjective:    Patient ID: Gabriela Conley, female    DOB: [birth year], 48 y.o.   MRN: [id number]  HPI Here for 3 days of sinus pressure, PND, and ST. No fever or cough. On Tylenol Cold and Sinus.    Review of Systems  Constitutional: Negative.   HENT: Positive for congestion, postnasal drip and sinus pressure.   Eyes: Negative.   Respiratory: Negative.        Objective:   Physical Exam  Constitutional: She appears well-developed and well-nourished.  HENT:  Right Ear: External ear normal.  Left Ear: External ear normal.  Nose: Nose normal.  Mouth/Throat: Oropharynx is clear and moist.  Eyes: Conjunctivae are normal.  Pulmonary/Chest: Effort normal and breath sounds normal.  Lymphadenopathy:    She has no cervical adenopathy.          Assessment & Plan:  Drink fluids.

## 2014-01-07 ENCOUNTER — Encounter: Payer: Self-pay | Admitting: Family Medicine

## 2014-02-05 ENCOUNTER — Other Ambulatory Visit: Payer: Self-pay | Admitting: Family Medicine

## 2014-08-03 ENCOUNTER — Other Ambulatory Visit: Payer: Self-pay | Admitting: Family Medicine

## 2014-09-05 ENCOUNTER — Encounter: Payer: Self-pay | Admitting: Family Medicine

## 2014-09-25 IMAGING — US US PELVIS COMPLETE
1 series · 14 of 25 positions shown · non-contrast
Comparison: None.

CLINICAL DATA: Vaginal bleeding, pelvic pain



[Series 1: us pelvis complete · 14 of 51 slices shown]
[im 1/51]
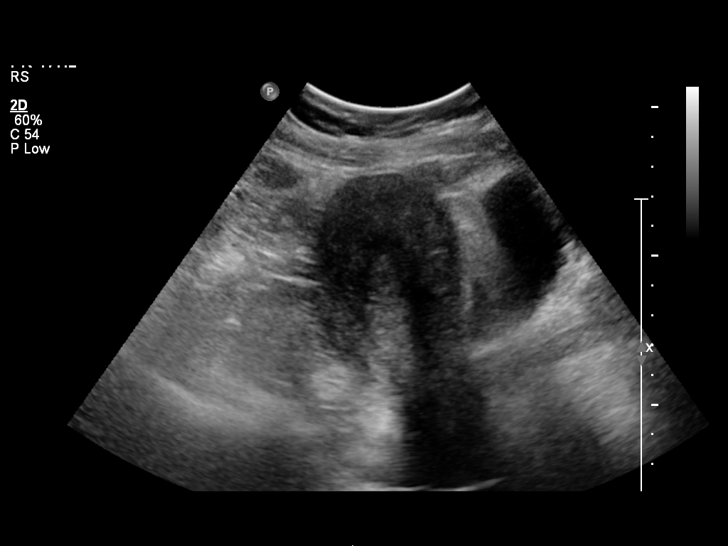
[im 5/51]
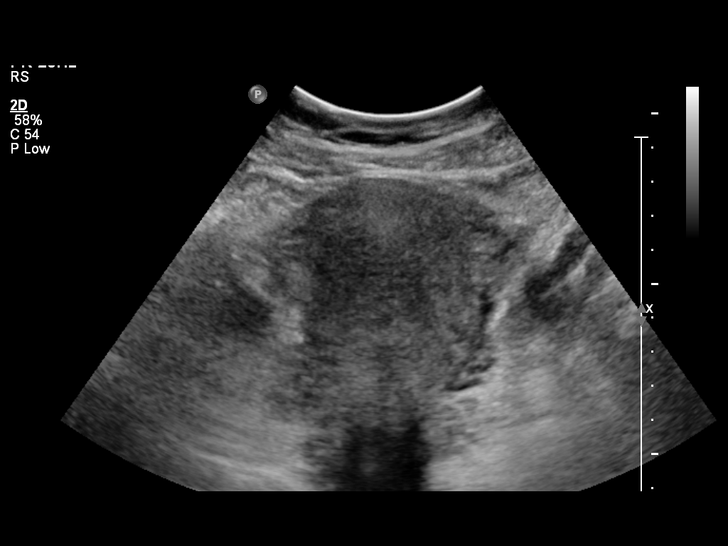
[im 9/51]
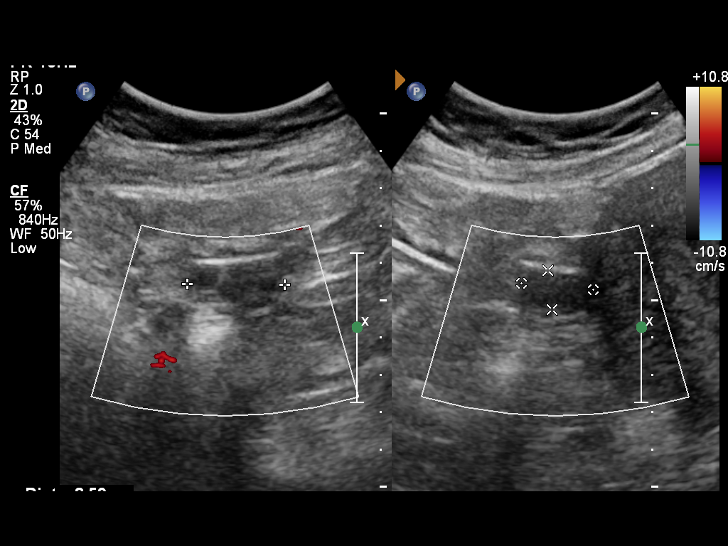
[im 13/51]
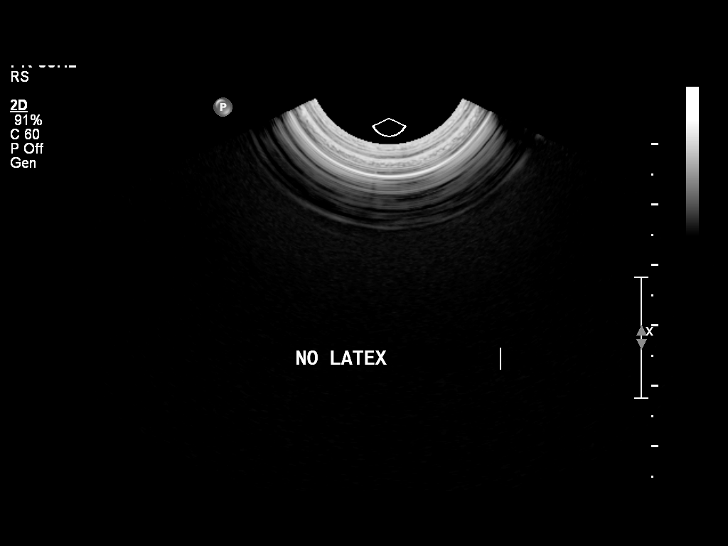
[im 17/51]
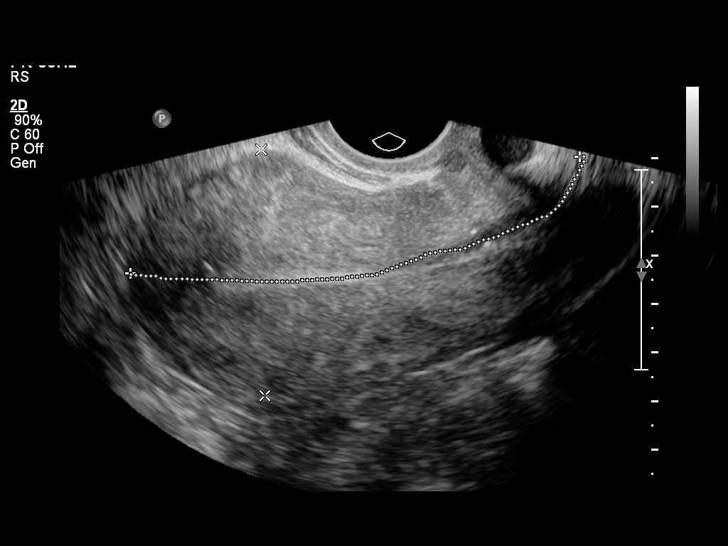
[im 19/51]
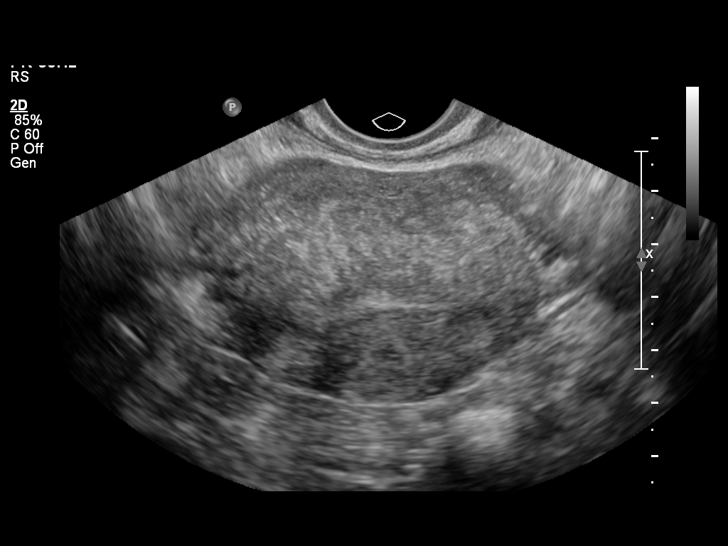
[im 23/51]
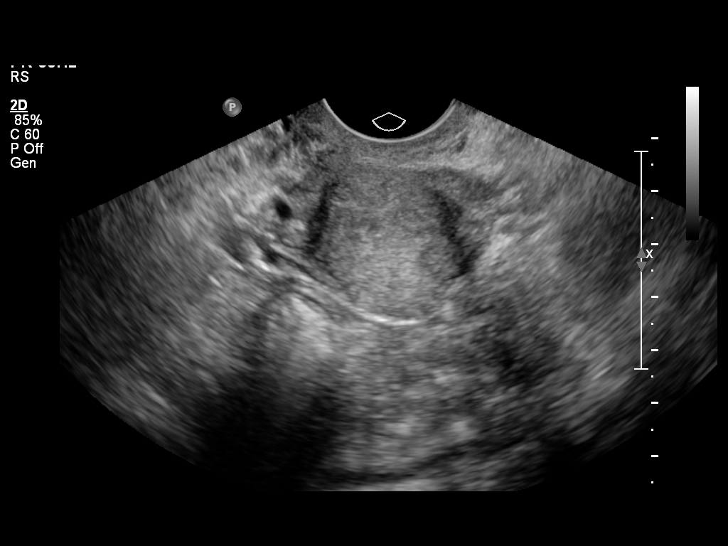
[im 28/51]
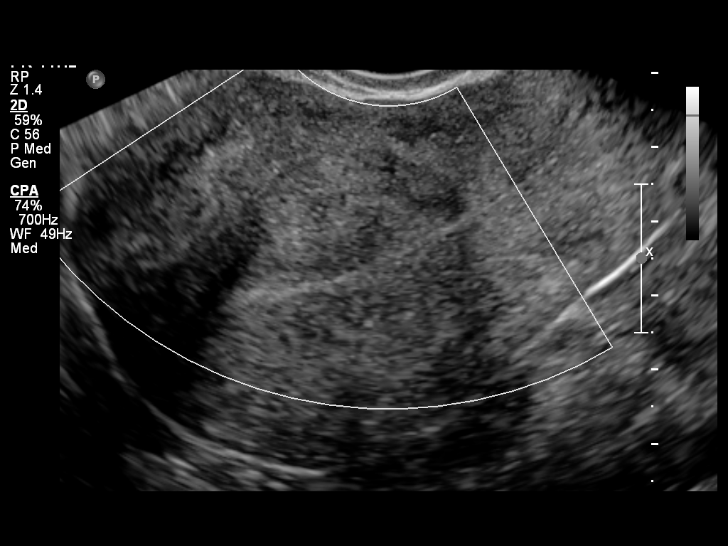
[im 32/51]
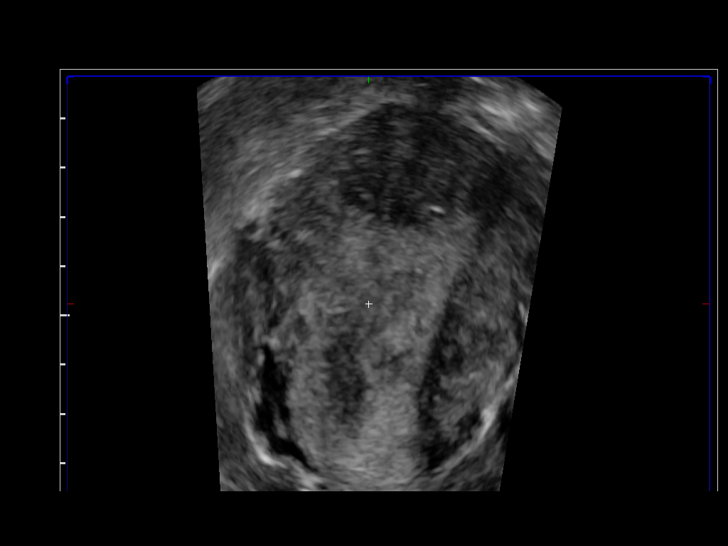
[im 34/51]
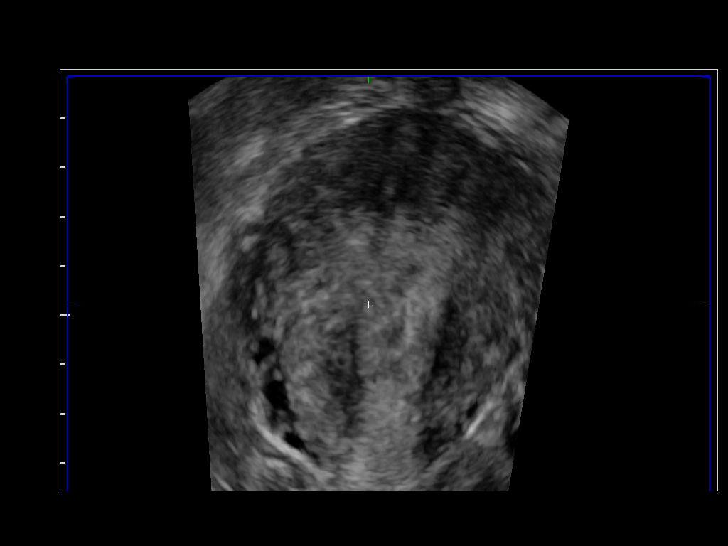
[im 38/51]
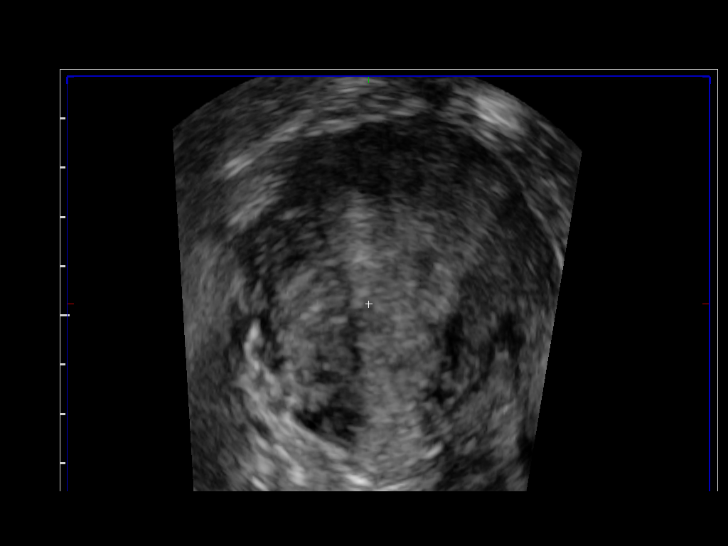
[im 42/51]
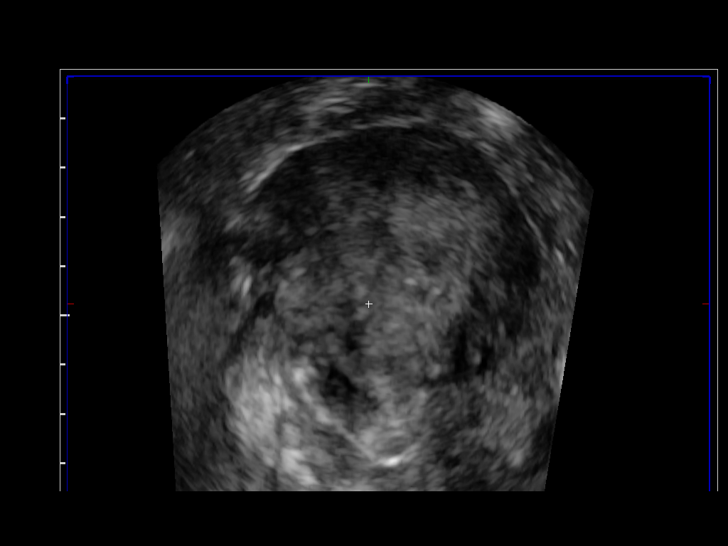
[im 46/51]
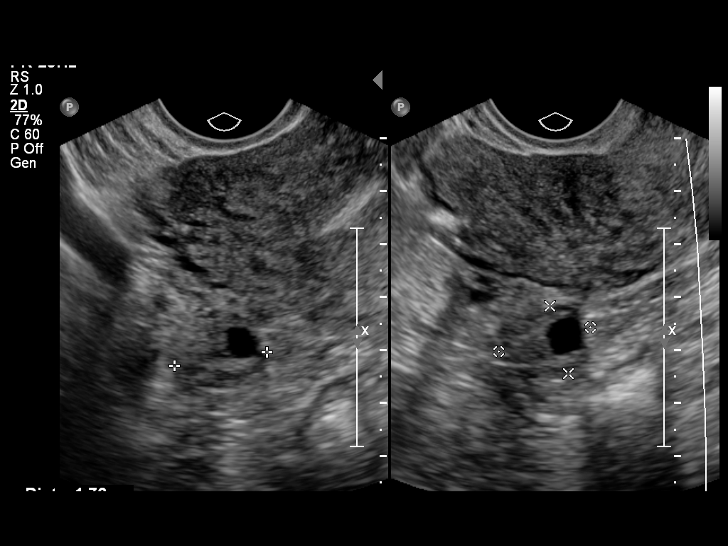
[im 51/51]
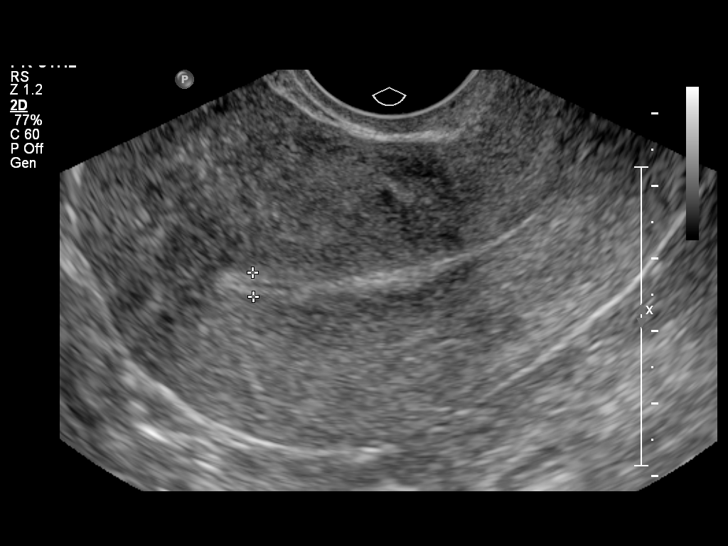

[14 of 25 positions shown; findings below may reference images not displayed]

FINDINGS: Uterus:  10.5 x 6.0 x 5.1 cm.  Anteverted, anteflexed.  No focal
abnormality.

Endometrium: 4 mm.  Uniformly thin and echogenic without focal
abnormality.

Right ovary: 1.8 x 1.8 x 1.3 cm.  Normal.

Left ovary: 2.9 x 2.0 x 3.0 cm.  Normal.

Other Findings:  No free fluid
IMPRESSION: Normal study.  No evidence of pelvic mass or other significant
abnormality.

## 2014-12-13 ENCOUNTER — Other Ambulatory Visit: Payer: Self-pay | Admitting: Family Medicine

## 2015-03-13 ENCOUNTER — Other Ambulatory Visit: Payer: Self-pay | Admitting: Family Medicine

## 2019-04-01 ENCOUNTER — Emergency Department (HOSPITAL_COMMUNITY)
Admission: EM | Admit: 2019-04-01 | Discharge: 2019-04-01 | Disposition: A | Discharge status: Home / Self Care | Payer: BLUE CROSS/BLUE SHIELD | Attending: Emergency Medicine | Admitting: Emergency Medicine

## 2019-04-01 ENCOUNTER — Emergency Department (HOSPITAL_COMMUNITY): Payer: BLUE CROSS/BLUE SHIELD

## 2019-04-01 ENCOUNTER — Encounter (HOSPITAL_COMMUNITY): Payer: Self-pay | Admitting: Emergency Medicine

## 2019-04-01 ENCOUNTER — Other Ambulatory Visit: Payer: Self-pay

## 2019-04-01 DIAGNOSIS — Z87891 Personal history of nicotine dependence: Secondary | ICD-10-CM | POA: Diagnosis not present

## 2019-04-01 DIAGNOSIS — Z79899 Other long term (current) drug therapy: Secondary | ICD-10-CM | POA: Insufficient documentation

## 2019-04-01 DIAGNOSIS — Z8709 Personal history of other diseases of the respiratory system: Secondary | ICD-10-CM | POA: Insufficient documentation

## 2019-04-01 DIAGNOSIS — R51 Headache: Secondary | ICD-10-CM | POA: Insufficient documentation

## 2019-04-01 DIAGNOSIS — F1290 Cannabis use, unspecified, uncomplicated: Secondary | ICD-10-CM | POA: Insufficient documentation

## 2019-04-01 DIAGNOSIS — K529 Noninfective gastroenteritis and colitis, unspecified: Secondary | ICD-10-CM | POA: Insufficient documentation

## 2019-04-01 DIAGNOSIS — R10816 Epigastric abdominal tenderness: Secondary | ICD-10-CM | POA: Insufficient documentation

## 2019-04-01 DIAGNOSIS — R112 Nausea with vomiting, unspecified: Secondary | ICD-10-CM | POA: Diagnosis present

## 2019-04-01 DIAGNOSIS — E039 Hypothyroidism, unspecified: Secondary | ICD-10-CM | POA: Diagnosis not present

## 2019-04-01 DIAGNOSIS — E119 Type 2 diabetes mellitus without complications: Secondary | ICD-10-CM | POA: Diagnosis not present

## 2019-04-01 LAB — CBC WITH DIFFERENTIAL/PLATELET
Abs Immature Granulocytes: 0.05 10*3/uL (ref 0.00–0.07)
Basophils Absolute: 0.1 10*3/uL (ref 0.0–0.1)
Basophils Relative: 1 %
Eosinophils Absolute: 0.2 10*3/uL (ref 0.0–0.5)
Eosinophils Relative: 2 %
HCT: 43.5 % (ref 36.0–46.0)
Hemoglobin: 14.7 g/dL (ref 12.0–15.0)
Immature Granulocytes: 1 %
Lymphocytes Relative: 22 %
Lymphs Abs: 2.4 10*3/uL (ref 0.7–4.0)
MCH: 30.8 pg (ref 26.0–34.0)
MCHC: 33.8 g/dL (ref 30.0–36.0)
MCV: 91.2 fL (ref 80.0–100.0)
Monocytes Absolute: 0.9 10*3/uL (ref 0.1–1.0)
Monocytes Relative: 9 %
Neutro Abs: 7.2 10*3/uL (ref 1.7–7.7)
Neutrophils Relative %: 65 %
Platelets: 267 10*3/uL (ref 150–400)
RBC: 4.77 MIL/uL (ref 3.87–5.11)
RDW: 12.5 % (ref 11.5–15.5)
WBC: 10.8 10*3/uL — ABNORMAL HIGH (ref 4.0–10.5)
nRBC: 0 % (ref 0.0–0.2)

## 2019-04-01 LAB — COMPREHENSIVE METABOLIC PANEL
ALT: 56 U/L — ABNORMAL HIGH (ref 0–44)
AST: 47 U/L — ABNORMAL HIGH (ref 15–41)
Albumin: 4.6 g/dL (ref 3.5–5.0)
Alkaline Phosphatase: 76 U/L (ref 38–126)
Anion gap: 11 (ref 5–15)
BUN: 11 mg/dL (ref 6–20)
CO2: 25 mmol/L (ref 22–32)
Calcium: 10 mg/dL (ref 8.9–10.3)
Chloride: 104 mmol/L (ref 98–111)
Creatinine, Ser: 0.81 mg/dL (ref 0.44–1.00)
GFR calc Af Amer: >60 mL/min (ref >60)
GFR calc non Af Amer: >60 mL/min (ref >60)
Glucose, Bld: 141 mg/dL — ABNORMAL HIGH (ref 70–99)
Potassium: 3.6 mmol/L (ref 3.5–5.1)
Sodium: 140 mmol/L (ref 135–145)
Total Bilirubin: 0.6 mg/dL (ref 0.3–1.2)
Total Protein: 7.8 g/dL (ref 6.5–8.1)

## 2019-04-01 LAB — LIPASE, BLOOD: Lipase: 29 U/L (ref 11–51)

## 2019-04-01 MED ORDER — IOHEXOL 300 MG/ML  SOLN
100.0000 mL | Freq: Once | INTRAMUSCULAR | Status: AC | PRN
  Administered 2019-04-01: 12:00:00 100 mL via INTRAVENOUS

## 2019-04-01 MED ORDER — ONDANSETRON HCL 4 MG/2ML IJ SOLN
4.0000 mg | Freq: Once | INTRAMUSCULAR | Status: AC
  Administered 2019-04-01: 11:00:00 4 mg via INTRAVENOUS
  Filled 2019-04-01: qty 2

## 2019-04-01 MED ORDER — MORPHINE SULFATE (PF) 4 MG/ML IV SOLN
4.0000 mg | Freq: Once | INTRAVENOUS | Status: AC
  Administered 2019-04-01: 4 mg via INTRAVENOUS
  Filled 2019-04-01: qty 1

## 2019-04-01 MED ORDER — SODIUM CHLORIDE 0.9 % IV BOLUS
1000.0000 mL | Freq: Once | INTRAVENOUS | Status: AC
  Administered 2019-04-01: 11:00:00 1000 mL via INTRAVENOUS

## 2019-04-01 MED ORDER — KETOROLAC TROMETHAMINE 30 MG/ML IJ SOLN
30.0000 mg | Freq: Once | INTRAMUSCULAR | Status: AC
  Administered 2019-04-01: 11:00:00 30 mg via INTRAVENOUS
  Filled 2019-04-01: qty 1

## 2019-04-01 MED ORDER — ONDANSETRON 8 MG PO TBDP: ORAL_TABLET | ORAL | 0 refills | Status: DC

## 2019-04-01 NOTE — ED Notes (Signed)
Patient is back from CT.

## 2019-04-01 NOTE — Discharge Instructions (Addendum)
Zofran as prescribed as needed for nausea.  Clear liquid diet for the next 12 hours, then slowly advance as tolerated.  Return to the emergency department for severe abdominal pain, worsening vomiting, high fever, or other new and concerning symptoms.

## 2019-04-01 NOTE — ED Triage Notes (Signed)
Pt sent from [facility] for evaluation of hiatal hernia, pt also nausea and overall dehydrated feeling x 2 days

## 2019-04-01 NOTE — ED Provider Notes (Signed)
[facility] EMERGENCY DEPARTMENT Provider Note   CSN: [id number] Arrival date & time: 04/01/19  4818    History   Chief Complaint Chief Complaint  Patient presents with  . Emesis  . Headache    HPI Gabriela Conley is a 53 y.o. female.     Patient is a 53 year old female with past medical history of diabetes, GERD, anxiety, and prior ventral hernia repair.  She presents today for evaluation of abdominal pain, nausea, and vomiting that began yesterday.  She describes discomfort in the upper abdomen along with loss of appetite.  She also has loose stools and vomiting.  All of this has been nonbloody.  She was seen at urgent care, then sent here for further evaluation.  The history is provided by the patient.  Emesis  Severity:  Moderate Duration:  2 days Timing:  Constant Progression:  Worsening Chronicity:  New Recent urination:  Normal Relieved by:  Nothing Exacerbated by: Eating, palpation.   Past Medical History:  Diagnosis Date  . Allergy   . Anxiety   . Asthma   . Diabetes mellitus without complication (HCC)    gestational   . GERD (gastroesophageal reflux disease)   . Hypothyroid     Patient Active Problem List   Diagnosis Date Noted  . Anemia 01/02/2011  . Hypothyroidism 01/02/2011  . Other and unspecified hyperlipidemia 01/02/2011  . Anxiety 01/02/2011    Past Surgical History:  Procedure Laterality Date  . BREAST SURGERY     removal of benign cyst   . CYSTECTOMY     removed from chest  . HERNIA REPAIR     umbilical   . TUBAL LIGATION       OB History    Gravida  4   Para  3   Term  2   Preterm  0   AB  1   Living  2     SAB  1   TAB  0   Ectopic  0   Multiple  0   Live Births               Home Medications    Prior to Admission medications   Medication Sig Start Date End Date Taking? Authorizing Provider  Multiple Vitamins-Minerals (MULTIVITAMIN WITH MINERALS) tablet Take 1 tablet by mouth daily.  Woman's   Yes [provider]  omeprazole (PRILOSEC) 20 MG capsule TAKE 1 CAPSULE DAILY Patient taking differently: Take 20 mg by mouth daily.  12/13/14  Yes Laurey Morale, MD  SYNTHROID 175 MCG tablet Take 175 mcg by mouth daily. 02/23/19  Yes [provider]  venlafaxine XR (EFFEXOR XR) 75 MG 24 hr capsule Take 1 capsule (75 mg total) by mouth daily. Patient taking differently: Take 150 mg by mouth daily.  08/10/13  Yes Laurey Morale, MD  azithromycin [facility]) 250 MG tablet As directed Patient not taking: Reported on 04/01/2019 12/01/13   Laurey Morale, MD  diazepam (VALIUM) 5 MG tablet Take 1 tablet (5 mg total) by mouth every 12 (twelve) hours as needed for anxiety. Patient not taking: Reported on 04/01/2019 08/10/13   Laurey Morale, MD  levothyroxine (SYNTHROID, LEVOTHROID) 150 MCG tablet TAKE 1 TABLET DAILY Patient not taking: No sig reported    Laurey Morale, MD  traMADol (ULTRAM) 50 MG tablet Take 1 tablet (50 mg total) by mouth every 6 (six) hours as needed. Patient not taking: Reported on 04/01/2019 11/15/13   Eulas Post,  MD    Family History Family History  Problem Relation Age of Onset  . Uterine cancer Mother   . Emphysema Mother   . Stomach cancer Father     Social History Social History   Tobacco Use  . Smoking status: Former Research scientist (life sciences)  . Smokeless tobacco: Never Used  Substance Use Topics  . Alcohol use: Yes    Comment: rare  . Drug use: Yes    Types: Marijuana     Allergies   Chocolate   Review of Systems Review of Systems  All other systems reviewed and are negative.    Physical Exam Updated Vital Signs BP (!) 147/99 (BP Location: Left Arm)   Pulse 88   Temp 99.1 F (37.3 C) (Oral)   Resp 16   Ht 5\' 2"  (1.575 m)   Wt 91.2 kg   LMP 02/17/2019   SpO2 96%   BMI 36.76 kg/m   Physical Exam Vitals signs and nursing note reviewed.  Constitutional:      General: She is not in acute distress.    Appearance: She is  well-developed. She is not diaphoretic.  HENT:     Head: Normocephalic and atraumatic.  Neck:     Musculoskeletal: Normal range of motion and neck supple.  Cardiovascular:     Rate and Rhythm: Normal rate and regular rhythm.     Heart sounds: No murmur. No friction rub. No gallop.   Pulmonary:     Effort: Pulmonary effort is normal. No respiratory distress.     Breath sounds: Normal breath sounds. No wheezing.  Abdominal:     General: Bowel sounds are normal. There is no distension.     Palpations: Abdomen is soft.     Tenderness: There is abdominal tenderness. There is no guarding.     Comments: There is tenderness to palpation in the epigastric region and right upper quadrant.  Musculoskeletal: Normal range of motion.  Skin:    General: Skin is warm and dry.  Neurological:     Mental Status: She is alert and oriented to person, place, and time.      ED Treatments / Results  Labs (all labs ordered are listed, but only abnormal results are displayed) Labs Reviewed  COMPREHENSIVE METABOLIC PANEL  LIPASE, BLOOD  CBC WITH DIFFERENTIAL/PLATELET    EKG None  Radiology No results found.  Procedures Procedures (including critical care time)  Medications Ordered in ED Medications  sodium chloride 0.9 % bolus 1,000 mL (has no administration in time range)  ondansetron (ZOFRAN) injection 4 mg (has no administration in time range)  ketorolac (TORADOL) 30 MG/ML injection 30 mg (has no administration in time range)  morphine 4 MG/ML injection 4 mg (has no administration in time range)     Initial Impression / Assessment and Plan / ED Course  I have reviewed the triage vital signs and the nursing notes.  Pertinent labs & imaging results that were available during my care of the patient were reviewed by me and considered in my medical decision making (see chart for details).  Patient presents here with complaints of abdominal discomfort, nausea, and vomiting that started  yesterday.  Patient was given IV fluids and medications here in the ER and is feeling much better.  Her work-up shows no significant abnormalities on her CT scan.  Her laboratory studies are essentially unremarkable.  At this point I see no indication for further work-up.  This will be treated as an enteritis.  Patient to return  or follow-up if not improving.  Final Clinical Impressions(s) / ED Diagnoses   Final diagnoses:  None    ED Discharge Orders    None       Veryl Speak, MD 04/01/19 1358

## 2019-04-01 NOTE — ED Notes (Signed)
Pt given ice chips

## 2019-04-01 NOTE — ED Notes (Signed)
Pt states she is able to call friends/family to keep them informed on her care.

## 2019-04-05 ENCOUNTER — Emergency Department (HOSPITAL_COMMUNITY)
Admission: EM | Admit: 2019-04-05 | Discharge: 2019-04-05 | Disposition: A | Discharge status: Home / Self Care | Payer: BLUE CROSS/BLUE SHIELD | Attending: Emergency Medicine | Admitting: Emergency Medicine

## 2019-04-05 ENCOUNTER — Emergency Department (HOSPITAL_COMMUNITY): Payer: BLUE CROSS/BLUE SHIELD

## 2019-04-05 ENCOUNTER — Other Ambulatory Visit: Payer: Self-pay

## 2019-04-05 ENCOUNTER — Encounter (HOSPITAL_COMMUNITY): Payer: Self-pay | Admitting: Emergency Medicine

## 2019-04-05 DIAGNOSIS — Z79899 Other long term (current) drug therapy: Secondary | ICD-10-CM | POA: Diagnosis not present

## 2019-04-05 DIAGNOSIS — Z87891 Personal history of nicotine dependence: Secondary | ICD-10-CM | POA: Insufficient documentation

## 2019-04-05 DIAGNOSIS — J45909 Unspecified asthma, uncomplicated: Secondary | ICD-10-CM | POA: Diagnosis not present

## 2019-04-05 DIAGNOSIS — E039 Hypothyroidism, unspecified: Secondary | ICD-10-CM | POA: Diagnosis not present

## 2019-04-05 DIAGNOSIS — K2901 Acute gastritis with bleeding: Secondary | ICD-10-CM | POA: Diagnosis not present

## 2019-04-05 DIAGNOSIS — R110 Nausea: Secondary | ICD-10-CM | POA: Diagnosis present

## 2019-04-05 DIAGNOSIS — E119 Type 2 diabetes mellitus without complications: Secondary | ICD-10-CM | POA: Insufficient documentation

## 2019-04-05 LAB — CBC
HCT: 41 % (ref 36.0–46.0)
Hemoglobin: 13.6 g/dL (ref 12.0–15.0)
MCH: 31 pg (ref 26.0–34.0)
MCHC: 33.2 g/dL (ref 30.0–36.0)
MCV: 93.4 fL (ref 80.0–100.0)
Platelets: 221 10*3/uL (ref 150–400)
RBC: 4.39 MIL/uL (ref 3.87–5.11)
RDW: 12.4 % (ref 11.5–15.5)
WBC: 7.4 10*3/uL (ref 4.0–10.5)
nRBC: 0 % (ref 0.0–0.2)

## 2019-04-05 LAB — COMPREHENSIVE METABOLIC PANEL
ALT: 55 U/L — ABNORMAL HIGH (ref 0–44)
AST: 40 U/L (ref 15–41)
Albumin: 4.2 g/dL (ref 3.5–5.0)
Alkaline Phosphatase: 70 U/L (ref 38–126)
Anion gap: 6 (ref 5–15)
BUN: 8 mg/dL (ref 6–20)
CO2: 29 mmol/L (ref 22–32)
Calcium: 9.5 mg/dL (ref 8.9–10.3)
Chloride: 106 mmol/L (ref 98–111)
Creatinine, Ser: 0.83 mg/dL (ref 0.44–1.00)
GFR calc Af Amer: >60 mL/min (ref >60)
GFR calc non Af Amer: >60 mL/min (ref >60)
Glucose, Bld: 124 mg/dL — ABNORMAL HIGH (ref 70–99)
Potassium: 3.6 mmol/L (ref 3.5–5.1)
Sodium: 141 mmol/L (ref 135–145)
Total Bilirubin: 0.7 mg/dL (ref 0.3–1.2)
Total Protein: 7.6 g/dL (ref 6.5–8.1)

## 2019-04-05 LAB — I-STAT BETA HCG BLOOD, ED (MC, WL, AP ONLY): I-stat hCG, quantitative: <5 m[IU]/mL (ref <5)

## 2019-04-05 LAB — LIPASE, BLOOD: Lipase: 27 U/L (ref 11–51)

## 2019-04-05 MED ORDER — METOCLOPRAMIDE HCL 5 MG/ML IJ SOLN
10.0000 mg | Freq: Once | INTRAMUSCULAR | Status: AC
  Administered 2019-04-05: 10 mg via INTRAVENOUS
  Filled 2019-04-05: qty 2

## 2019-04-05 MED ORDER — LIDOCAINE VISCOUS HCL 2 % MT SOLN
15.0000 mL | Freq: Once | OROMUCOSAL | Status: AC
  Administered 2019-04-05: 15 mL via OROMUCOSAL
  Filled 2019-04-05: qty 15

## 2019-04-05 MED ORDER — FAMOTIDINE 20 MG PO TABS: 20.0000 mg | ORAL_TABLET | Freq: Two times a day (BID) | ORAL | 0 refills | Status: DC

## 2019-04-05 MED ORDER — SODIUM CHLORIDE 0.9% FLUSH: 3.0000 mL | Freq: Once | INTRAVENOUS | Status: DC

## 2019-04-05 MED ORDER — ALUM & MAG HYDROXIDE-SIMETH 200-200-20 MG/5ML PO SUSP
15.0000 mL | Freq: Once | ORAL | Status: AC
  Administered 2019-04-05: 15 mL via ORAL
  Filled 2019-04-05: qty 30

## 2019-04-05 MED ORDER — DIPHENHYDRAMINE HCL 12.5 MG/5ML PO ELIX
12.5000 mg | ORAL_SOLUTION | Freq: Once | ORAL | Status: AC
  Administered 2019-04-05: 12.5 mg via ORAL
  Filled 2019-04-05: qty 10

## 2019-04-05 MED ORDER — PROCHLORPERAZINE MALEATE 10 MG PO TABS: 10.0000 mg | ORAL_TABLET | Freq: Two times a day (BID) | ORAL | 0 refills | Status: DC | PRN

## 2019-04-05 NOTE — ED Provider Notes (Signed)
[facility] EMERGENCY DEPARTMENT Provider Note   CSN: [id number] Arrival date & time: 04/05/19  1252    History   Chief Complaint Chief Complaint  Patient presents with  . Abdominal Pain  . Nausea    HPI Gabriela Conley is a 52 y.o. female.     The history is provided by the patient and medical records.  Abdominal Pain  Pain location:  Epigastric Pain quality: aching, cramping and dull   Pain radiates to:  Back Pain severity:  Moderate Onset quality:  Gradual Duration:  6 days Timing:  Constant Progression:  Unchanged Chronicity:  New Context: eating   Context: not awakening from sleep, not retching, not sick contacts and not trauma   Relieved by:  Nothing Worsened by:  Eating Ineffective treatments:  Heat, cold, bowel activity, urination, vomiting, palpation, movement, lying down, liquids and flatus Associated symptoms: fatigue, melena, nausea and vomiting   Associated symptoms: no chest pain, no chills, no constipation, no cough, no dysuria, no fever, no hematemesis, no hematochezia, no hematuria, no shortness of breath, no vaginal bleeding and no vaginal discharge   Risk factors: NSAID use   Risk factors: not elderly and not pregnant     Past Medical History:  Diagnosis Date  . Allergy   . Anxiety   . Asthma   . Diabetes mellitus without complication (HCC)    gestational   . GERD (gastroesophageal reflux disease)   . Hypothyroid     Patient Active Problem List   Diagnosis Date Noted  . Anemia 01/02/2011  . Hypothyroidism 01/02/2011  . Other and unspecified hyperlipidemia 01/02/2011  . Anxiety 01/02/2011    Past Surgical History:  Procedure Laterality Date  . BREAST SURGERY     removal of benign cyst   . CYSTECTOMY     removed from chest  . HERNIA REPAIR     umbilical   . TUBAL LIGATION       OB History    Gravida  4   Para  3   Term  2   Preterm  0   AB  1   Living  2     SAB  1   TAB  0   Ectopic  0   Multiple  0   Live Births               Home Medications    Prior to Admission medications   Medication Sig Start Date End Date Taking? Authorizing Provider  Multiple Vitamins-Minerals (MULTIVITAMIN WITH MINERALS) tablet Take 1 tablet by mouth daily. Woman's   Yes [provider]  omeprazole (PRILOSEC) 20 MG capsule TAKE 1 CAPSULE DAILY Patient taking differently: Take 20 mg by mouth daily.  12/13/14  Yes Laurey Morale, MD  ondansetron (ZOFRAN ODT) 8 MG disintegrating tablet 8mg  ODT q4 hours prn nausea Patient taking differently: Take 8 mg by mouth every 4 (four) hours as needed for nausea.  04/01/19  Yes Delo, Nathaneil Canary, MD  SYNTHROID 175 MCG tablet Take 175 mcg by mouth daily. 02/23/19  Yes [provider]  venlafaxine XR (EFFEXOR XR) 75 MG 24 hr capsule Take 1 capsule (75 mg total) by mouth daily. Patient taking differently: Take 150 mg by mouth daily.  08/10/13  Yes Laurey Morale, MD  azithromycin [facility]) 250 MG tablet As directed Patient not taking: Reported on 04/01/2019 12/01/13   Laurey Morale, MD  diazepam (VALIUM) 5 MG tablet Take 1 tablet (5 mg total) by mouth  every 12 (twelve) hours as needed for anxiety. Patient not taking: Reported on 04/01/2019 08/10/13   Laurey Morale, MD  famotidine (PEPCID) 20 MG tablet Take 1 tablet (20 mg total) by mouth 2 (two) times daily. 04/05/19   Lonzo Candy, MD  levothyroxine (SYNTHROID, LEVOTHROID) 150 MCG tablet TAKE 1 TABLET DAILY Patient not taking: Reported on 04/05/2019    Laurey Morale, MD  prochlorperazine (COMPAZINE) 10 MG tablet Take 1 tablet (10 mg total) by mouth 2 (two) times daily as needed for nausea or vomiting. 04/05/19   Lonzo Candy, MD  traMADol (ULTRAM) 50 MG tablet Take 1 tablet (50 mg total) by mouth every 6 (six) hours as needed. Patient not taking: Reported on 04/01/2019 11/15/13   Eulas Post, MD    Family History Family History  Problem Relation Age of Onset  . Uterine cancer Mother   .  Emphysema Mother   . Stomach cancer Father     Social History Social History   Tobacco Use  . Smoking status: Former Research scientist (life sciences)  . Smokeless tobacco: Never Used  Substance Use Topics  . Alcohol use: Yes    Comment: rare  . Drug use: Yes    Types: Marijuana     Allergies   Chocolate   Review of Systems Review of Systems  Constitutional: Positive for fatigue. Negative for chills and fever.  Respiratory: Negative for cough and shortness of breath.   Cardiovascular: Negative for chest pain.  Gastrointestinal: Positive for abdominal pain, melena, nausea and vomiting. Negative for constipation, hematemesis and hematochezia.  Genitourinary: Negative for dysuria, hematuria, vaginal bleeding and vaginal discharge.  All other systems reviewed and are negative.    Physical Exam Updated Vital Signs BP 121/80   Pulse 75   Temp 98.1 F (36.7 C) (Oral)   Resp (!) 22   SpO2 98%   Physical Exam Vitals signs and nursing note reviewed.  Constitutional:      Appearance: She is well-developed.  HENT:     Head: Normocephalic and atraumatic.  Eyes:     Conjunctiva/sclera: Conjunctivae normal.  Neck:     Musculoskeletal: Neck supple.  Cardiovascular:     Rate and Rhythm: Normal rate.  Pulmonary:     Effort: Pulmonary effort is normal. No respiratory distress.     Breath sounds: Normal breath sounds. No stridor. No wheezing or rhonchi.  Abdominal:     Palpations: Abdomen is soft.     Tenderness: There is abdominal tenderness in the epigastric area. There is no right CVA tenderness, left CVA tenderness, guarding or rebound. Negative signs include Murphy's sign, Rovsing's sign and McBurney's sign.  Skin:    General: Skin is warm and dry.     Capillary Refill: Capillary refill takes less than 2 seconds.  Neurological:     Mental Status: She is alert and oriented to person, place, and time.      ED Treatments / Results  Labs (all labs ordered are listed, but only abnormal  results are displayed) Labs Reviewed  COMPREHENSIVE METABOLIC PANEL - Abnormal; Notable for the following components:      Result Value   Glucose, Bld 124 (*)    ALT 55 (*)    All other components within normal limits  LIPASE, BLOOD  CBC  URINALYSIS, ROUTINE W REFLEX MICROSCOPIC  I-STAT BETA HCG BLOOD, ED ([facility], [facility], [facility] ONLY)    EKG EKG Interpretation  Date/Time:  Monday April 05 2019 18:02:42 EDT Ventricular Rate:  74 PR Interval:  QRS Duration: 102 QT Interval:  401 QTC Calculation: 445 R Axis:   83 Text Interpretation:  Sinus rhythm RSR' in V1 or V2, right VCD or RVH Abnormal ekg Confirmed by Carmin Muskrat [phone]) on 04/05/2019 6:36:03 PM   Radiology Dg Chest Portable 1 View  Result Date: 04/05/2019 CLINICAL DATA:  Lower abdominal pain.  Nausea and vomiting. EXAM: PORTABLE CHEST 1 VIEW COMPARISON:  None. FINDINGS: Heart appears prominent but size is difficult to evaluate due to technique. The hila, mediastinum, lungs, and pleura are unremarkable. IMPRESSION: The heart appears prominent size but this could be at least partially due to portable technique. No other acute abnormalities. Electronically Signed   By: Dorise Bullion III M.D   On: 04/05/2019 17:42    Procedures Procedures (including critical care time)  Medications Ordered in ED Medications  sodium chloride flush (NS) 0.9 % injection 3 mL (3 mLs Intravenous Not Given 04/05/19 1754)  lidocaine (XYLOCAINE) 2 % viscous mouth solution 15 mL (15 mLs Mouth/Throat Given 04/05/19 1753)  alum & mag hydroxide-simeth (MAALOX/MYLANTA) 200-200-20 MG/5ML suspension 15 mL (15 mLs Oral Given 04/05/19 1753)  diphenhydrAMINE (BENADRYL) 12.5 MG/5ML elixir 12.5 mg (12.5 mg Oral Given 04/05/19 1753)  metoCLOPramide (REGLAN) injection 10 mg (10 mg Intravenous Given 04/05/19 1914)     Initial Impression / Assessment and Plan / ED Course  I have reviewed the triage vital signs and the nursing notes.  Pertinent labs & imaging results that were  available during my care of the patient were reviewed by me and considered in my medical decision making (see chart for details).         Kazi Montoro is a 53 y.o. female who presents with epigastric pain, nausea, emesis  PMH: diabetes, GERD, anxiety, and prior umbilical hernia repair  Upon initial assessment patient, calm, alert and interactive, cooperative, not tachy, not hypotensive, afebrile, no increased wob, abd soft, no signs of peritonitis Will give GI cocktail and reassess Will obtain ekg, upright cxr  CBC shows no acute drop in hgb, 13 today from 14 No emergent changes in CMP, ALT 50s Lipase negative  Preg test negative  Upon reassessing patient, calm, pain improved after gi cocktail, still nauseated though, will rx with different anti emetic and po challenge. Upon reassessing pt, nausea improved, tolerating po intake without emesis. Will prescribe H2 blocker, anti emetic, will fu with pcp for potential h pylori testing if symptoms continue Based on the above information I believe patient is stable for discharge with appropriate outpatient follow up.  Patient given strict return precaution and instructions to follow up with PCP. Patient expressed understanding and agreement with plan.  The care of this patient was supervised by Dr. Vanita Panda, who agreed with the plan and management of the patient.      Final Clinical Impressions(s) / ED Diagnoses   Final diagnoses:  Other acute gastritis with hemorrhage    ED Discharge Orders         Ordered    famotidine (PEPCID) 20 MG tablet  2 times daily     04/05/19 2055    prochlorperazine (COMPAZINE) 10 MG tablet  2 times daily PRN     04/05/19 2055           Lonzo Candy, MD 04/05/19 2139    Carmin Muskrat, MD 04/08/19 2241

## 2019-04-05 NOTE — ED Triage Notes (Addendum)
Patient reports she was seen here a few days ago for lower abd pain, nausea and vomiting. States she was told she had gastroenteritis and was sent home with zofran that is stopping her vomiting but not her nausea, spoke with her pcp who told her to come back to the ED. Pt a/ox4, resp e/u, nad.

## 2019-04-07 ENCOUNTER — Other Ambulatory Visit: Payer: Self-pay

## 2019-04-07 ENCOUNTER — Encounter: Payer: Self-pay | Admitting: Family Medicine

## 2019-04-07 ENCOUNTER — Ambulatory Visit (INDEPENDENT_AMBULATORY_CARE_PROVIDER_SITE_OTHER): Payer: BLUE CROSS/BLUE SHIELD | Admitting: Family Medicine

## 2019-04-07 DIAGNOSIS — R1010 Upper abdominal pain, unspecified: Secondary | ICD-10-CM

## 2019-04-07 DIAGNOSIS — A09 Infectious gastroenteritis and colitis, unspecified: Secondary | ICD-10-CM

## 2019-04-07 NOTE — Progress Notes (Signed)
Subjective:    Patient ID: Gabriela Conley, female    DOB: [birth year], 53 y.o.   MRN: [id number]  HPI Virtual Visit via Video Note  I connected with the patient on 04/07/19 at  9:30 AM EDT by a video enabled telemedicine application and verified that I am speaking with the correct person using two identifiers.  Location patient: home Location provider:work or home office Persons participating in the virtual visit: patient, provider  I discussed the limitations of evaluation and management by telemedicine and the availability of in person appointments. The patient expressed understanding and agreed to proceed.   HPI: Here to re-establish after an absence of 5 years, as well as to follow up on 2 recent ER visits. We last saw her 5 years ago, and then she moved to [place] to be near her son. About 7 months ago she moved back to [place]. About 7 days ago she woke up with upper abdominal pains, low grade fevers, nausea and vomiting, and some diarrhea. The pains would sometimes radiate through to the middle of her back. She has a hx of hiatal hernia and GERD, and she was taking Omeprazole at the time. On 04-01-19 at the ER her exam was remarkable for upper abdominal tenderness. Her labs showed a normal WBC count, normal lipase, and mildly elevated transaminases. A CT of the abdomen and pelvis showed a large hiatal hernia and some hepatic steatosis, but no acute changes. It was felt she had an enteritis, so she was given IV fluids and Zofran. At home she did not improve (although the fevers went away) so she returned to the ER on 04-05-19. That day she was switched from Omeprazole to Pepcid and given Compazine. Today she still has the abdominal pains that come and go, nausea (the Compazine stooped the vomiting), and diarrhea. She has no appetite but is drinking some water and Gatorade. She notes that last year she visited [place] for 2 days, and after she returned to [place] she developed similar symptoms to  what she has now. She was diagnosed with an intestinal parasite infection, and she took several courses of medications before this resolved. She does not remember exactly what the parasite was.    ROS: See pertinent positives and negatives per HPI.  Past Medical History:  Diagnosis Date   Allergy    Anxiety    Asthma    Diabetes mellitus without complication ([place])    gestational    GERD (gastroesophageal reflux disease)    Hypothyroid     Past Surgical History:  Procedure Laterality Date   BREAST SURGERY     removal of benign cyst    CYSTECTOMY     removed from chest   HERNIA REPAIR     umbilical    TUBAL LIGATION      Family History  Problem Relation Age of Onset   Uterine cancer Mother    Emphysema Mother    Stomach cancer Father      Current Outpatient Medications:    famotidine (PEPCID) 20 MG tablet, Take 1 tablet (20 mg total) by mouth 2 (two) times daily., Disp: 30 tablet, Rfl: 0   Multiple Vitamins-Minerals (MULTIVITAMIN WITH MINERALS) tablet, Take 1 tablet by mouth daily. Woman's, Disp: , Rfl:    ondansetron (ZOFRAN ODT) 8 MG disintegrating tablet, 8mg  ODT q4 hours prn nausea (Patient taking differently: Take 8 mg by mouth every 4 (four) hours as needed for nausea. ), Disp: 6 tablet, Rfl: 0  prochlorperazine (COMPAZINE) 10 MG tablet, Take 1 tablet (10 mg total) by mouth 2 (two) times daily as needed for nausea or vomiting., Disp: 20 tablet, Rfl: 0   SYNTHROID 175 MCG tablet, Take 175 mcg by mouth daily., Disp: , Rfl:    venlafaxine XR (EFFEXOR-XR) 150 MG 24 hr capsule, , Disp: , Rfl:   EXAM:  VITALS per patient if applicable:  GENERAL: alert, oriented, appears well and in no acute distress  HEENT: atraumatic, conjunttiva clear, no obvious abnormalities on inspection of external nose and ears  NECK: normal movements of the head and neck  LUNGS: on inspection no signs of respiratory distress, breathing rate appears normal, no obvious  gross SOB, gasping or wheezing  CV: no obvious cyanosis  MS: moves all visible extremities without noticeable abnormality  PSYCH/NEURO: pleasant and cooperative, no obvious depression or anxiety, speech and thought processing grossly intact  ASSESSMENT AND PLAN: She has had one week of upper abdominal pain with nausea, vomiting, and diarrhea. She initially had fevers but not now. I think the most likely etiology is gall bladder diease so we will set her up for an abdominal US soon. She will also stop by our lab so we can check for stool ova and parasites, a C difficile study, and stool cultures. Use Compazine as needed.  Alysia Penna, MD  Discussed the following assessment and plan:  No diagnosis found.     I discussed the assessment and treatment plan with the patient. The patient was provided an opportunity to ask questions and all were answered. The patient agreed with the plan and demonstrated an understanding of the instructions.   The patient was advised to call back or seek an in-person evaluation if the symptoms worsen or if the condition fails to improve as anticipated.     Review of Systems     Objective:   Physical Exam        Assessment & Plan:

## 2019-04-08 ENCOUNTER — Other Ambulatory Visit: Payer: Self-pay

## 2019-04-08 ENCOUNTER — Other Ambulatory Visit: Payer: BLUE CROSS/BLUE SHIELD

## 2019-04-09 NOTE — Addendum Note (Signed)
Addended by: Elmer Picker on: 04/09/2019 08:35 AM   Modules accepted: Orders

## 2019-04-09 NOTE — Addendum Note (Signed)
Addended by: Elmer Picker on: 04/09/2019 08:31 AM   Modules accepted: Orders

## 2019-04-11 LAB — CLOSTRIDIUM DIFFICILE TOXIN B, QUALITATIVE, REAL-TIME PCR: Toxigenic C. Difficile by PCR: NOT DETECTED

## 2019-04-13 LAB — STOOL CULTURE
MICRO NUMBER:: 541849
MICRO NUMBER:: 541850
MICRO NUMBER:: 541852
SHIGA RESULT:: NOT DETECTED
SPECIMEN QUALITY:: ADEQUATE
SPECIMEN QUALITY:: ADEQUATE
SPECIMEN QUALITY:: ADEQUATE

## 2019-04-13 LAB — OVA AND PARASITE EXAMINATION
CONCENTRATE RESULT:: NONE SEEN
MICRO NUMBER:: 541851
SPECIMEN QUALITY:: ADEQUATE
TRICHROME RESULT:: NONE SEEN

## 2019-04-15 ENCOUNTER — Telehealth: Payer: Self-pay | Admitting: Family Medicine

## 2019-04-15 DIAGNOSIS — R1010 Upper abdominal pain, unspecified: Secondary | ICD-10-CM

## 2019-04-15 NOTE — Telephone Encounter (Signed)
I called and spoke with pt about her lab results.  She stated that since they were all negative she is not sure what she needs to do.  She is still having the pain.  Dr. Sarajane Jews please advise. Thanks

## 2019-04-15 NOTE — Telephone Encounter (Signed)
I have referred her to GI for further evaluation

## 2019-05-03 ENCOUNTER — Ambulatory Visit
Admission: RE | Admit: 2019-05-03 | Discharge: 2019-05-03 | Disposition: A | Discharge status: Home / Self Care | Payer: BLUE CROSS/BLUE SHIELD | Source: Ambulatory Visit | Attending: Family Medicine | Admitting: Family Medicine

## 2019-05-03 DIAGNOSIS — R1010 Upper abdominal pain, unspecified: Secondary | ICD-10-CM

## 2019-05-03 NOTE — Addendum Note (Signed)
Addended by: Alysia Penna A on: 05/03/2019 01:24 PM   Modules accepted: Orders

## 2019-05-07 ENCOUNTER — Telehealth: Payer: Self-pay | Admitting: Family Medicine

## 2019-05-07 NOTE — Telephone Encounter (Signed)
Medication Refill - Medication: venlafaxine XR (EFFEXOR-XR) 150 MG 24 hr capsule   Has the patient contacted their pharmacy? Yes.   (Agent: If no, request that the patient contact the pharmacy for the refill.) (Agent: If yes, when and what did the pharmacy advise?)  Preferred Pharmacy (with phone number or street name): [id number] [place]: Please be advised that RX refills may take up to 3 business days. We ask that you follow-up with your pharmacy.

## 2019-05-10 NOTE — Telephone Encounter (Signed)
Dr. Fry please advise. Thanks  

## 2019-05-10 NOTE — Telephone Encounter (Signed)
Call in #[phone] with 3 rf  

## 2019-05-11 MED ORDER — VENLAFAXINE HCL ER 150 MG PO CP24: 150.0000 mg | ORAL_CAPSULE | Freq: Every day | ORAL | 3 refills | Status: DC

## 2019-05-11 NOTE — Telephone Encounter (Signed)
Refill sent to the pharmacy 

## 2019-06-04 ENCOUNTER — Encounter: Payer: Self-pay | Admitting: Gastroenterology

## 2019-06-04 ENCOUNTER — Ambulatory Visit (INDEPENDENT_AMBULATORY_CARE_PROVIDER_SITE_OTHER): Payer: BC Managed Care – PPO | Admitting: Gastroenterology

## 2019-06-04 VITALS — Ht 62.0 in | Wt 195.0 lb

## 2019-06-04 DIAGNOSIS — K625 Hemorrhage of anus and rectum: Secondary | ICD-10-CM

## 2019-06-04 DIAGNOSIS — K449 Diaphragmatic hernia without obstruction or gangrene: Secondary | ICD-10-CM | POA: Diagnosis not present

## 2019-06-04 DIAGNOSIS — R1013 Epigastric pain: Secondary | ICD-10-CM | POA: Diagnosis not present

## 2019-06-04 MED ORDER — NA SULFATE-K SULFATE-MG SULF 17.5-3.13-1.6 GM/177ML PO SOLN: 1.0000 | Freq: Once | ORAL | 0 refills | Status: AC

## 2019-06-04 NOTE — Progress Notes (Signed)
TELEHEALTH VISIT  Referring Provider: Laurey Morale, MD Primary Care Physician:  Laurey Morale, MD   Tele-visit due to COVID-19 pandemic Patient requested visit virtually, consented to the virtual encounter via video enabled telemedicine application (Zoom) Contact made at: 14:35 06/04/19 Patient verified by name and date of birth Location of patient: Home Location provider: [facility] Names of persons participating: Me, patient, Gabriela Conley CMA Time spent on telehealth visit:  32 minutes I discussed the limitations of evaluation and management by telemedicine. The patient expressed understanding and agreed to proceed.  Reason for Consultation:  Upper abdominal pain   IMPRESSION:  LUQ Upper abdominal pain Large hiatal hernia on CT Parasite infection last year Intermittent melena Bright red blood on the toilet paper Hepatic steatosis on imaging ALT 55 with otherwise normal liver enzymes Serum glucose 124 Father with stomach cancer at age 56  Upper abdominal pain not explained by CT abd/pelvis with contrast, abdominal ultrasound, or stool studies (C diff, O&P, culture).   ? Post-infectious IBS    PLAN: Increase omeprazole to 40 mg QAM EGD and colonoscopy for further evaluation Obtain records from  [place] of probiotic after endoscopy if findings are negative   Please see the "Patient Instructions" section for addition details about the plan.  HPI: Gabriela Conley is a 53 y.o. female referred by Dr. Sarajane Jews for upper abdominal pain. The history is obtained through the patient and review of her electronic health record.   She has a hx of hiatal hernia and GERD, and she was taking Omeprazole at the time. On 04-01-19 at the ER her exam was remarkable for upper abdominal tenderness. Her labs showed a normal WBC count, normal lipase, and mildly elevated transaminases. A CT of the abdomen and pelvis showed a large hiatal hernia and some hepatic  steatosis, but no acute changes. It was felt she had an enteritis, so she was given IV fluids and Zofran. At home she did not improve (although the fevers went away) so she returned to the ER on 04-05-19. That day she was switched from Omeprazole to Pepcid and given Compazine. Today she still has the abdominal pains that come and go, nausea (the Compazine stooped the vomiting), and diarrhea.   She took the nausea medication. Omeprazole 20 QAM.   Constant non-radiating LUQ "stinging" pain that started last year. Worsened by touch. Back in the same location is also sore. Pain worsened by defecation of hard stools. No associated straining.    Has seen intermittent dark stools. Noted blood on the toilet paper with passage of hard stool. Three bowel daily. She is concerned about a tear. Associated nausea.   Visited [place] in March 2019 and after she returned to [place] she developed similar symptoms to what she has now. She was diagnosed with an intestinal parasite infection, and she took several courses of medications before this resolved. She does not remember exactly what the parasite was.   Occasional Aleve for back pain.  She wants to avoid medications. No recent probiotic use.   No prior upper endoscopy. Colonoscopy 3 years in [place] at [facility] for the evaluation of loose stools. Her intestines were "swollen" but they didn't tell her anything else.   Abdominal imaging: Abdominal ultrasound 05/03/19: echogenic liver, otherwise normal CT abd/pelvis with contrast 04/01/19: no acute findings, large hiatal hernia, hepatic steatosis CT abd/pelvis without contrast 05/21/13: large hiatal hernia  Recent labs: C Diff stool negative 04/09/19 Negative stool O&P 04/09/19 Negative  stool culture 04/09/19 Normal CMP except for ALT 55, glucose 124 04/05/19 Normal CBC with platelets 221 04/05/19  Father with stomach cancer at age 7. Mother had uterine cancer at age 65.  No known family history of colon cancer or  polyps. No other family history of uterine/endometrial cancer, pancreatic cancer or gastric/stomach cancer.  Past Medical History:  Diagnosis Date  . Allergy   . Anxiety   . Asthma   . Diabetes mellitus without complication (HCC)    gestational   . GERD (gastroesophageal reflux disease)   . Hypothyroid     Past Surgical History:  Procedure Laterality Date  . BREAST SURGERY     removal of benign cyst   . CYSTECTOMY     removed from chest  . HERNIA REPAIR     umbilical   . TUBAL LIGATION      Current Outpatient Medications  Medication Sig Dispense Refill  . famotidine (PEPCID) 20 MG tablet Take 1 tablet (20 mg total) by mouth 2 (two) times daily. 30 tablet 0  . Multiple Vitamins-Minerals (MULTIVITAMIN WITH MINERALS) tablet Take 1 tablet by mouth daily. Woman's    . ondansetron (ZOFRAN ODT) 8 MG disintegrating tablet 8mg  ODT q4 hours prn nausea (Patient taking differently: Take 8 mg by mouth every 4 (four) hours as needed for nausea. ) 6 tablet 0  . prochlorperazine (COMPAZINE) 10 MG tablet Take 1 tablet (10 mg total) by mouth 2 (two) times daily as needed for nausea or vomiting. 20 tablet 0  . SYNTHROID 175 MCG tablet Take 175 mcg by mouth daily.    Marland Kitchen venlafaxine XR (EFFEXOR-XR) 150 MG 24 hr capsule Take 1 capsule (150 mg total) by mouth daily with breakfast. 90 capsule 3   No current facility-administered medications for this visit.     Allergies as of 06/04/2019 - Review Complete 06/04/2019  Allergen Reaction Noted  . 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 Uterine cancer Mother   . Emphysema Mother   . Stomach cancer Father     Social History   Socioeconomic History  . Marital status: Single    Spouse name: Not on file  . Number of children: Not on file  . Years of education: Not on file  . Highest education level: Not on file  Occupational History  . Not on file  Social Needs  . Financial resource strain: Not on  file  . Food insecurity    Worry: Not on file    Inability: Not on file  . Transportation needs    Medical: Not on file    Non-medical: Not on file  Tobacco Use  . Smoking status: Former Research scientist (life sciences)  . Smokeless tobacco: Never Used  Substance and Sexual Activity  . Alcohol use: Yes    Comment: rare  . Drug use: Yes    Types: Marijuana  . Sexual activity: Not on file  Lifestyle  . Physical activity    Days per week: Not on file    Minutes per session: Not on file  . Stress: Not on file  Relationships  . Social Herbalist on phone: Not on file    Gets together: Not on file    Attends religious service: Not on file    Active member of club or organization: Not on file    Attends meetings of clubs or organizations: Not on file    Relationship status: Not on file  .  Intimate partner violence    Fear of current or ex partner: Not on file    Emotionally abused: Not on file    Physically abused: Not on file    Forced sexual activity: Not on file  Other Topics Concern  . Not on file  Social History Narrative  . Not on file    Review of Systems: ALL ROS discussed and all others negative except listed in HPI. Developed a pruritic rash on both extensor arm surfaces.    Physical Exam: Complete physical exam not performed due to the limits inherent in a telehealth encounter.  General: Awake, alert, and oriented, and well communicative. In no acute distress.  HEENT: EOMI, non-icteric sclera, NCAT, MMM  Neck: Normal movement of head and neck  Pulm: No labored breathing, speaking in full sentences without conversational dyspnea  Derm: No apparent lesions or bruising in visible field  MS: Moves all visible extremities without noticeable abnormality  Psych: Pleasant, cooperative, normal speech, normal affect and normal insight Neuro: Alert and appropriate   Gabriela Dermody L. Tarri Glenn, MD, MPH [facility] 06/04/2019, 2:59 PM

## 2019-06-04 NOTE — Patient Instructions (Signed)
If you are age 52 or older, your body mass index should be between 23-30. Your Body mass index is 35.67 kg/m. If this is out of the aforementioned range listed, please consider follow up with your Primary Care Provider.  If you are age 32 or younger, your body mass index should be between 19-25. Your Body mass index is 35.67 kg/m. If this is out of the aformentioned range listed, please consider follow up with your Primary Care Provider.   You have been scheduled for an endoscopy and colonoscopy. Please follow the written instructions given to you at your visit today. Please pick up your prep supplies at the pharmacy within the next 1-3 days. If you use inhalers (even only as needed), please bring them with you on the day of your procedure.  Increase your omeprazole to 40mg  Qam

## 2019-06-15 ENCOUNTER — Telehealth: Payer: Self-pay | Admitting: Gastroenterology

## 2019-06-15 NOTE — Telephone Encounter (Signed)
Noted! Thank you

## 2019-06-16 ENCOUNTER — Encounter: Payer: BC Managed Care – PPO | Admitting: Gastroenterology

## 2019-10-10 ENCOUNTER — Other Ambulatory Visit: Payer: Self-pay | Admitting: Family Medicine

## 2019-10-15 ENCOUNTER — Other Ambulatory Visit: Payer: Self-pay

## 2019-10-15 ENCOUNTER — Telehealth: Payer: Self-pay | Admitting: Family Medicine

## 2019-10-15 MED ORDER — SYNTHROID 175 MCG PO TABS: 175.0000 ug | ORAL_TABLET | Freq: Every day | ORAL | 0 refills | Status: DC

## 2019-10-15 NOTE — Telephone Encounter (Signed)
Per Dr.Fry this medication hs not been prescribed by him in over 5 years. Spoke to pt and advised. Pt stated she was living in [place] and came back last year to [place]. With Dr.fry. Pt does not have any insurance until jan.2021. Pt has been scheduled in Jan. 30 day supply sent to pt pharmacy.

## 2019-10-15 NOTE — Telephone Encounter (Signed)
Medication Refill - Medication: SYNTHROID 175 MCG tablet  Pt will be out of medication tomorrow and Pt doesn't have insurance until January, so she is asking if this Rx can be sent as a 30 day supply   Has the patient contacted their pharmacy? No. (Agent: If no, request that the patient contact the pharmacy for the refill.) (Agent: If yes, when and what did the pharmacy advise?)  Preferred Pharmacy (with phone number or street name):  [id number] [address], [place] Phone:  [phone]  Fax:  [phone]       Agent: Please be advised that RX refills may take up to 3 business days. We ask that you follow-up with your pharmacy.

## 2019-11-05 ENCOUNTER — Other Ambulatory Visit: Payer: Self-pay | Admitting: Family Medicine

## 2019-11-07 ENCOUNTER — Other Ambulatory Visit: Payer: Self-pay | Admitting: Family Medicine

## 2019-11-11 ENCOUNTER — Other Ambulatory Visit: Payer: Self-pay

## 2019-11-11 ENCOUNTER — Other Ambulatory Visit: Payer: Self-pay | Admitting: Family Medicine

## 2019-11-11 ENCOUNTER — Encounter: Payer: Self-pay | Admitting: Family Medicine

## 2019-11-11 ENCOUNTER — Ambulatory Visit: Payer: 59 | Admitting: Family Medicine

## 2019-11-11 VITALS — BP 138/80 | HR 88 | Temp 97.9°F | Wt 205.4 lb

## 2019-11-11 DIAGNOSIS — R197 Diarrhea, unspecified: Secondary | ICD-10-CM

## 2019-11-11 DIAGNOSIS — R1084 Generalized abdominal pain: Secondary | ICD-10-CM

## 2019-11-11 DIAGNOSIS — N939 Abnormal uterine and vaginal bleeding, unspecified: Secondary | ICD-10-CM | POA: Diagnosis not present

## 2019-11-11 DIAGNOSIS — E039 Hypothyroidism, unspecified: Secondary | ICD-10-CM | POA: Diagnosis not present

## 2019-11-11 LAB — CBC WITH DIFFERENTIAL/PLATELET
Basophils Absolute: 0.1 10*3/uL (ref 0.0–0.1)
Basophils Relative: 0.9 % (ref 0.0–3.0)
Eosinophils Absolute: 0.5 10*3/uL (ref 0.0–0.7)
Eosinophils Relative: 6.4 % — ABNORMAL HIGH (ref 0.0–5.0)
HCT: 38.3 % (ref 36.0–46.0)
Hemoglobin: 12.5 g/dL (ref 12.0–15.0)
Lymphocytes Relative: 23.6 % (ref 12.0–46.0)
Lymphs Abs: 1.8 10*3/uL (ref 0.7–4.0)
MCHC: 32.6 g/dL (ref 30.0–36.0)
MCV: 85.9 fl (ref 78.0–100.0)
Monocytes Absolute: 0.7 10*3/uL (ref 0.1–1.0)
Monocytes Relative: 9.7 % (ref 3.0–12.0)
Neutro Abs: 4.5 10*3/uL (ref 1.4–7.7)
Neutrophils Relative %: 59.4 % (ref 43.0–77.0)
Platelets: 248 10*3/uL (ref 150.0–400.0)
RBC: 4.45 Mil/uL (ref 3.87–5.11)
RDW: 14.3 % (ref 11.5–15.5)
WBC: 7.6 10*3/uL (ref 4.0–10.5)

## 2019-11-11 LAB — BASIC METABOLIC PANEL
BUN: 13 mg/dL (ref 6–23)
CO2: 30 mEq/L (ref 19–32)
Calcium: 9.9 mg/dL (ref 8.4–10.5)
Chloride: 104 mEq/L (ref 96–112)
Creatinine, Ser: 0.77 mg/dL (ref 0.40–1.20)
GFR: 78.69 mL/min (ref >60.00)
Glucose, Bld: 137 mg/dL — ABNORMAL HIGH (ref 70–99)
Potassium: 4.2 mEq/L (ref 3.5–5.1)
Sodium: 140 mEq/L (ref 135–145)

## 2019-11-11 LAB — T3, FREE: T3, Free: 3.3 pg/mL (ref 2.3–4.2)

## 2019-11-11 LAB — HEPATIC FUNCTION PANEL
ALT: 53 U/L — ABNORMAL HIGH (ref 0–35)
AST: 47 U/L — ABNORMAL HIGH (ref 0–37)
Albumin: 4.5 g/dL (ref 3.5–5.2)
Alkaline Phosphatase: 84 U/L (ref 39–117)
Bilirubin, Direct: 0.1 mg/dL (ref 0.0–0.3)
Total Bilirubin: 0.4 mg/dL (ref 0.2–1.2)
Total Protein: 7.5 g/dL (ref 6.0–8.3)

## 2019-11-11 LAB — T4, FREE: Free T4: 0.95 ng/dL (ref 0.60–1.60)

## 2019-11-11 LAB — TSH: TSH: 0.76 u[IU]/mL (ref 0.35–4.50)

## 2019-11-11 MED ORDER — OMEPRAZOLE 40 MG PO CPDR: 40.0000 mg | DELAYED_RELEASE_CAPSULE | Freq: Every day | ORAL | 3 refills | Status: DC

## 2019-11-11 NOTE — Progress Notes (Signed)
   Subjective:    Patient ID: Gabriela Conley, female    DOB: 28-Nov-1965, 54 y.o.   MRN: [id number]  HPI Here for several issues. First she continues to have upper abdominal pain and daily diarrhea. She saw Dr. Tarri Conley last July, and she had planned to do upper and lower endoscopy on [name], but for some reason this fell through. Since then she has continued to take Omeprazole and take a probiotic [employer], dispensing). Also she needs refills on Synthroid, and we need to check her levels. Lastly she had stopped having menstrual cycles about 6 months ago, and then a few days ago she started to have vaginal bleeding again. No other symptoms. She has not seen a GYN in many years.    Review of Systems  Constitutional: Negative.   Respiratory: Negative.   Cardiovascular: Negative.   Gastrointestinal: Positive for abdominal pain and diarrhea. Negative for abdominal distention, anal bleeding, blood in stool, constipation, nausea, rectal pain and vomiting.  Endocrine: Negative.   Genitourinary: Positive for vaginal bleeding. Negative for flank pain.       Objective:   Physical Exam Constitutional:      General: She is not in acute distress.    Appearance: Normal appearance.  Cardiovascular:     Rate and Rhythm: Normal rate and regular rhythm.     Pulses: Normal pulses.     Heart sounds: Normal heart sounds.  Pulmonary:     Effort: Pulmonary effort is normal.     Breath sounds: Normal breath sounds.  Abdominal:     General: Abdomen is flat. Bowel sounds are normal. There is no distension.     Palpations: Abdomen is soft. There is no mass.     Tenderness: There is no guarding or rebound.     Hernia: No hernia is present.     Comments: Mild generalized tenderness   Neurological:     General: No focal deficit present.     Mental Status: She is alert and oriented to person, place, and time.           Assessment & Plan:  For the abdominal pain and diarrhea, I advised her to contact Dr. Tarri Conley in GI as  soon as possible so the endoscopies can be scheduled. For the hypothyroidism, we will check levels today. For the perimenopausal bleeding, I advised her to see a GYN to evaluate further.  Gabriela Penna, MD

## 2019-11-15 ENCOUNTER — Other Ambulatory Visit: Payer: Self-pay | Admitting: Family Medicine

## 2019-11-15 MED ORDER — LEVOTHYROXINE SODIUM 175 MCG PO TABS: 175.0000 ug | ORAL_TABLET | Freq: Every day | ORAL | 3 refills | Status: DC

## 2020-01-05 ENCOUNTER — Other Ambulatory Visit: Payer: Self-pay | Admitting: Family Medicine

## 2020-02-05 ENCOUNTER — Other Ambulatory Visit: Payer: Self-pay | Admitting: Family Medicine

## 2020-02-26 ENCOUNTER — Ambulatory Visit: Payer: 59 | Attending: Internal Medicine

## 2020-02-26 DIAGNOSIS — Z23 Encounter for immunization: Secondary | ICD-10-CM

## 2020-02-26 NOTE — Progress Notes (Signed)
   Covid-19 Vaccination Clinic  Name:  Gabriela Conley    MRN: [id number] DOB: 1966-02-13  02/26/2020  [name] was observed post Covid-19 immunization for 15 minutes without incident. She was provided with Vaccine Information Sheet and instruction to access the V-Safe system.   [name] was instructed to call 911 with any severe reactions post vaccine: Marland Kitchen Difficulty breathing  . Swelling of face and throat  . A fast heartbeat  . A bad rash all over body  . Dizziness and weakness   Immunizations Administered    Name Date Dose VIS Date Route   Pfizer COVID-19 Vaccine 02/26/2020 11:12 AM 0.3 mL 12/29/2018 Intramuscular   Manufacturer: [place]   Lot: [id number]   [place]: [id number]

## 2020-03-20 ENCOUNTER — Ambulatory Visit: Payer: 59 | Attending: Internal Medicine

## 2020-03-20 DIAGNOSIS — Z23 Encounter for immunization: Secondary | ICD-10-CM

## 2020-03-20 NOTE — Progress Notes (Signed)
   Covid-19 Vaccination Clinic  Name:  Gabriela Conley    MRN: [id number] DOB: [birth year]  03/20/2020  Gabriela Conley was observed post Covid-19 immunization for 15 minutes without incident. She was provided with Vaccine Information Sheet and instruction to access the V-Safe system.   Gabriela Conley was instructed to call 911 with any severe reactions post vaccine: Marland Kitchen Difficulty breathing  . Swelling of face and throat  . A fast heartbeat  . A bad rash all over body  . Dizziness and weakness   Immunizations Administered    Name Date Dose VIS Date Route   Pfizer COVID-19 Vaccine 03/20/2020  4:23 PM 0.3 mL 12/29/2018 Intramuscular   Manufacturer: [place]   Lot: [id number]   [place]: [id number]

## 2020-04-08 ENCOUNTER — Other Ambulatory Visit: Payer: Self-pay

## 2020-04-08 ENCOUNTER — Encounter (HOSPITAL_COMMUNITY): Payer: Self-pay | Admitting: Emergency Medicine

## 2020-04-08 ENCOUNTER — Emergency Department (HOSPITAL_COMMUNITY)
Admission: EM | Admit: 2020-04-08 | Discharge: 2020-04-08 | Disposition: A | Discharge status: Home / Self Care | Payer: 59 | Attending: Emergency Medicine | Admitting: Emergency Medicine

## 2020-04-08 DIAGNOSIS — K644 Residual hemorrhoidal skin tags: Secondary | ICD-10-CM | POA: Diagnosis not present

## 2020-04-08 DIAGNOSIS — E039 Hypothyroidism, unspecified: Secondary | ICD-10-CM | POA: Insufficient documentation

## 2020-04-08 DIAGNOSIS — R110 Nausea: Secondary | ICD-10-CM | POA: Insufficient documentation

## 2020-04-08 DIAGNOSIS — Z87891 Personal history of nicotine dependence: Secondary | ICD-10-CM | POA: Insufficient documentation

## 2020-04-08 DIAGNOSIS — K625 Hemorrhage of anus and rectum: Secondary | ICD-10-CM | POA: Diagnosis present

## 2020-04-08 DIAGNOSIS — R10815 Periumbilic abdominal tenderness: Secondary | ICD-10-CM | POA: Diagnosis not present

## 2020-04-08 DIAGNOSIS — Z79899 Other long term (current) drug therapy: Secondary | ICD-10-CM | POA: Insufficient documentation

## 2020-04-08 DIAGNOSIS — K649 Unspecified hemorrhoids: Secondary | ICD-10-CM

## 2020-04-08 DIAGNOSIS — R10816 Epigastric abdominal tenderness: Secondary | ICD-10-CM | POA: Insufficient documentation

## 2020-04-08 DIAGNOSIS — K6289 Other specified diseases of anus and rectum: Secondary | ICD-10-CM

## 2020-04-08 LAB — CBC
HCT: 39 % (ref 36.0–46.0)
Hemoglobin: 12.4 g/dL (ref 12.0–15.0)
MCH: 27.9 pg (ref 26.0–34.0)
MCHC: 31.8 g/dL (ref 30.0–36.0)
MCV: 87.6 fL (ref 80.0–100.0)
Platelets: 195 10*3/uL (ref 150–400)
RBC: 4.45 MIL/uL (ref 3.87–5.11)
RDW: 14.2 % (ref 11.5–15.5)
WBC: 7.2 10*3/uL (ref 4.0–10.5)
nRBC: 0 % (ref 0.0–0.2)

## 2020-04-08 LAB — COMPREHENSIVE METABOLIC PANEL
ALT: 50 U/L — ABNORMAL HIGH (ref 0–44)
AST: 34 U/L (ref 15–41)
Albumin: 3.8 g/dL (ref 3.5–5.0)
Alkaline Phosphatase: 71 U/L (ref 38–126)
Anion gap: 7 (ref 5–15)
BUN: 11 mg/dL (ref 6–20)
CO2: 26 mmol/L (ref 22–32)
Calcium: 9.1 mg/dL (ref 8.9–10.3)
Chloride: 107 mmol/L (ref 98–111)
Creatinine, Ser: 0.64 mg/dL (ref 0.44–1.00)
GFR calc Af Amer: >60 mL/min (ref >60)
GFR calc non Af Amer: >60 mL/min (ref >60)
Glucose, Bld: 103 mg/dL — ABNORMAL HIGH (ref 70–99)
Potassium: 3.8 mmol/L (ref 3.5–5.1)
Sodium: 140 mmol/L (ref 135–145)
Total Bilirubin: <0.1 mg/dL — ABNORMAL LOW (ref 0.3–1.2)
Total Protein: 7 g/dL (ref 6.5–8.1)

## 2020-04-08 LAB — LIPASE, BLOOD: Lipase: 26 U/L (ref 11–51)

## 2020-04-08 MED ORDER — HYDROCORTISONE (PERIANAL) 2.5 % EX CREA: 1.0000 "application " | TOPICAL_CREAM | Freq: Two times a day (BID) | CUTANEOUS | 0 refills | Status: DC

## 2020-04-08 NOTE — ED Triage Notes (Signed)
Pt. Stated, Gabriela Conley had a lot of bleeding and pain in my rectal area for the last 2 weeks. I have hemrroids. Ive not been able to sleep.

## 2020-04-08 NOTE — ED Triage Notes (Signed)
Pt. Stated, Im suppose to have a endoscopy and a colonoscopy but have not since COVID

## 2020-04-08 NOTE — ED Provider Notes (Signed)
[facility] EMERGENCY DEPARTMENT Provider Note   CSN: [id number] Arrival date & time: 04/08/20  1131     History Chief Complaint  Patient presents with  . Rectal Bleeding    Gabriela Conley is a 54 y.o. female.  Patient presents with complaint of rectal pain and bright red blood per rectum.  Patient states that for the past 2 weeks or so she has been having more pain in the rectal area, worse with bowel movements.  She has noted mixed blood, stringy in nature.  Last night she had a larger bowel movement that she describes as mucus with blood.  This prompted ED visit today.  She has a history of hemorrhoids.  She also has generalized abdominal pain with nausea but no vomiting.  She takes omeprazole.  No history of abdominal surgeries.  She did 1 warm water soak last night.  She has not felt lightheaded or passed out.  She denies chest pain or shortness of breath.  She follows with Dr. Tarri Glenn of GI and was supposed to have endoscopy and colonoscopy performed, however these have not yet occurred.        Past Medical History:  Diagnosis Date  . Allergy   . Anxiety   . Asthma   . Diabetes mellitus without complication (HCC)    gestational   . GERD (gastroesophageal reflux disease)   . Hypothyroid     Patient Active Problem List   Diagnosis Date Noted  . Anemia 01/02/2011  . Hypothyroidism 01/02/2011  . Other and unspecified hyperlipidemia 01/02/2011  . Anxiety 01/02/2011    Past Surgical History:  Procedure Laterality Date  . BREAST SURGERY     removal of benign cyst   . CYSTECTOMY     removed from chest  . HERNIA REPAIR     umbilical   . TUBAL LIGATION       OB History    Gravida  4   Para  3   Term  2   Preterm  0   AB  1   Living  2     SAB  1   TAB  0   Ectopic  0   Multiple  0   Live Births              Family History  Problem Relation Age of Onset  . Uterine cancer Mother   . Emphysema Mother   . Stomach cancer Father      Social History   Tobacco Use  . Smoking status: Former Research scientist (life sciences)  . Smokeless tobacco: Never Used  Substance Use Topics  . Alcohol use: Yes    Comment: rare  . Drug use: Yes    Types: Marijuana    Home Medications Prior to Admission medications   Medication Sig Start Date End Date Taking? Authorizing Provider  levothyroxine (SYNTHROID) 175 MCG tablet Take 1 tablet (175 mcg total) by mouth daily before breakfast. 11/15/19   Laurey Morale, MD  omeprazole (PRILOSEC) 40 MG capsule Take 1 capsule (40 mg total) by mouth daily. 11/11/19   Laurey Morale, MD  venlafaxine XR (EFFEXOR-XR) 150 MG 24 hr capsule TAKE 1 CAPSULE (150 MG TOTAL) BY MOUTH DAILY WITH BREAKFAST. 02/07/20   Laurey Morale, MD    Allergies    Chocolate  Review of Systems   Review of Systems  Constitutional: Negative for fever.  HENT: Negative for rhinorrhea and sore throat.   Eyes: Negative for redness.  Respiratory: Negative  for cough.   Cardiovascular: Negative for chest pain.  Gastrointestinal: Positive for abdominal pain, blood in stool, nausea and rectal pain. Negative for diarrhea and vomiting.  Genitourinary: Negative for dysuria.  Musculoskeletal: Negative for myalgias.  Skin: Negative for rash.  Neurological: Negative for headaches.    Physical Exam Updated Vital Signs BP 136/85 (BP Location: Left Arm)   Pulse 79   Temp 99.3 F (37.4 C)   Resp 17   Ht 5\' 2"  (1.575 m)   Wt 87.1 kg   LMP 11/09/2019   SpO2 98%   BMI 35.12 kg/m   Physical Exam Vitals and nursing note reviewed. Exam conducted with a chaperone present.  Constitutional:      Appearance: She is well-developed.  HENT:     Head: Normocephalic and atraumatic.  Eyes:     General:        Right eye: No discharge.        Left eye: No discharge.     Conjunctiva/sclera: Conjunctivae normal.  Cardiovascular:     Rate and Rhythm: Normal rate and regular rhythm.     Heart sounds: Normal heart sounds.  Pulmonary:     Effort: Pulmonary  effort is normal.     Breath sounds: Normal breath sounds.  Abdominal:     Palpations: Abdomen is soft.     Tenderness: There is abdominal tenderness. There is no guarding or rebound.     Comments: Patient with mild tenderness to palpation generally but worse in the epigastrium and periumbilical area.  Genitourinary:    Rectum: Tenderness and external hemorrhoid present.     Comments: Patient has a large, nonthrombosed, nonbleeding external hemorrhoid that is very tender to palpation. Musculoskeletal:     Cervical back: Normal range of motion and neck supple.  Skin:    General: Skin is warm and dry.  Neurological:     Mental Status: She is alert.     ED Results / Procedures / Treatments   Labs (all labs ordered are listed, but only abnormal results are displayed) Labs Reviewed  COMPREHENSIVE METABOLIC PANEL - Abnormal; Notable for the following components:      Result Value   Glucose, Bld 103 (*)    ALT 50 (*)    Total Bilirubin <0.1 (*)    All other components within normal limits  CBC  LIPASE, BLOOD    EKG None  Radiology No results found.  Procedures Procedures (including critical care time)  Medications Ordered in ED Medications - No data to display  ED Course  I have reviewed the triage vital signs and the nursing notes.  Pertinent labs & imaging results that were available during my care of the patient were reviewed by me and considered in my medical decision making (see chart for details).  Patient seen and examined.  External rectal exam performed with chaperone.  Will check lab work.  Vital signs reviewed and are as follows: BP 136/85 (BP Location: Left Arm)   Pulse 79   Temp 99.3 F (37.4 C)   Resp 17   Ht 5\' 2"  (1.575 m)   Wt 87.1 kg   LMP 11/09/2019   SpO2 98%   BMI 35.12 kg/m   Labs are reassuring.  Plan is to discharged home with Anusol suppositories, warm soaks/sitz bath's twice a day, follow-up with GI.  Patient in agreement.  The  patient was urged to return to the Emergency Department immediately with worsening of current symptoms, worsening abdominal pain, persistent vomiting, blood  noted in stools, fever, or any other concerns. The patient verbalized understanding.     MDM Rules/Calculators/A&P                      Patient with minor rectal bleeding, painful hemorrhoid.  Her main complaint today is rectal pain.  She has some mild diffuse abdominal pain.  Normal white blood cell count.  Normal red blood cell count.  Do not feel that patient requires CT imaging of the abdomen or pelvis at this point.  She would benefit from aggressive treatment of her hemorrhoids and follow-up with GI as planned.  Strict return instructions as above.   Final Clinical Impression(s) / ED Diagnoses Final diagnoses:  Rectal pain  Hemorrhoids, unspecified hemorrhoid type    Rx / DC Orders ED Discharge Orders         Ordered    hydrocortisone (ANUSOL-HC) 2.5 % rectal cream  2 times daily     04/08/20 1600           Carlisle Cater, PA-C 04/08/20 1605    Carmin Muskrat, MD 04/09/20 2103

## 2020-04-08 NOTE — Discharge Instructions (Signed)
Please read and follow all provided instructions.  Your diagnoses today include:  1. Rectal pain   2. Hemorrhoids, unspecified hemorrhoid type     Tests performed today include:  Blood counts and electrolytes  Blood tests to check liver and kidney function  Vital signs. See below for your results today.   Medications prescribed:   Anusol - topical cream to help the pain from inflamed hemorrhoids  Take any prescribed medications only as directed.  Home care instructions:   Follow any educational materials contained in this packet.  Follow-up instructions: Please follow-up with your primary care doctor or gastroenterologist for further evaluation.  Return instructions:  SEEK IMMEDIATE MEDICAL ATTENTION IF:  The pain does not go away or becomes severe   A temperature above 101F develops   Repeated vomiting occurs (multiple episodes)   The pain becomes localized to portions of the abdomen. The right side could possibly be appendicitis. In an adult, the left lower portion of the abdomen could be colitis or diverticulitis.   Large amounts of blood is being passed in stools or vomit (bright red or black tarry stools)   You develop chest pain, difficulty breathing, dizziness or fainting, or become confused, poorly responsive, or inconsolable (young children)  If you have any other emergent concerns regarding your health  Additional Information: Abdominal (belly) pain can be caused by many things. Your caregiver performed an examination and possibly ordered blood/urine tests and imaging (CT scan, x-rays, ultrasound). Many cases can be observed and treated at home after initial evaluation in the emergency department. Even though you are being discharged home, abdominal pain can be unpredictable. Therefore, you need a repeated exam if your pain does not resolve, returns, or worsens. Most patients with abdominal pain don't have to be admitted to the hospital or have surgery, but  serious problems like appendicitis and gallbladder attacks can start out as nonspecific pain. Many abdominal conditions cannot be diagnosed in one visit, so follow-up evaluations are very important.  Your vital signs today were: BP 136/85 (BP Location: Left Arm)   Pulse 79   Temp 99.3 F (37.4 C)   Resp 17   Ht 5\' 2"  (1.575 m)   Wt 87.1 kg   LMP 11/09/2019   SpO2 98%   BMI 35.12 kg/m  If your blood pressure (bp) was elevated above 135/85 this visit, please have this repeated by your doctor within one month. --------------

## 2020-04-10 ENCOUNTER — Telehealth: Payer: Self-pay | Admitting: Gastroenterology

## 2020-04-10 NOTE — Telephone Encounter (Signed)
Pt calling to schedule ECL that was recommended last year. Please advise if ok to schedule procedures or if pt needs to be seen again in office.

## 2020-04-10 NOTE — Telephone Encounter (Signed)
Patient is calling to schedule a double that I see was recommended for her to have last year. Will she need to re-consult for the EGD or can I go ahead and schedule.

## 2020-04-11 NOTE — Telephone Encounter (Signed)
Please schedule office visit, first. Thank you.

## 2020-04-11 NOTE — Telephone Encounter (Signed)
Spoke with patient, patient scheduled follow up for 05/26/20 at 8:50 am with Dr. Tarri Glenn.

## 2020-05-26 ENCOUNTER — Ambulatory Visit: Payer: 59 | Admitting: Gastroenterology

## 2020-05-26 ENCOUNTER — Encounter: Payer: Self-pay | Admitting: Gastroenterology

## 2020-05-26 VITALS — BP 120/80 | HR 83 | Ht 62.5 in | Wt 199.0 lb

## 2020-05-26 DIAGNOSIS — R1013 Epigastric pain: Secondary | ICD-10-CM | POA: Diagnosis not present

## 2020-05-26 DIAGNOSIS — R194 Change in bowel habit: Secondary | ICD-10-CM | POA: Diagnosis not present

## 2020-05-26 DIAGNOSIS — K625 Hemorrhage of anus and rectum: Secondary | ICD-10-CM | POA: Diagnosis not present

## 2020-05-26 NOTE — Patient Instructions (Addendum)
I have recommended an upper endoscopy and colonoscopy for further evaluation of your symptoms.  Tips for colonoscopy:  - Stay well hydrated for 3-4 days prior to the exam. This reduces nausea and dehydration.  - To prevent skin/hemorrhoid irritation - prior to wiping, put A&Dointment or vaseline on the toilet paper. - Keep a towel or pad on the bed.  - Drink  64oz of clear liquids in the morning of prep day (prior to starting the prep) to be sure that there is enough fluid to flush the colon and stay hydrated!!!! This is in addition to the fluids required for preparation. - Use of a flavored hard candy, such as grape Anise Salvo, can counteract some of the flavor of the prep and may prevent some nausea.   Continue Omeprazole 40mg - once daily.   We will try to obtain records from [facility].   If you are age 52 or older, your body mass index should be between 23-30. Your Body mass index is 35.82 kg/m. If this is out of the aforementioned range listed, please consider follow up with your Primary Care Provider.  If you are age 15 or younger, your body mass index should be between 19-25. Your Body mass index is 35.82 kg/m. If this is out of the aformentioned range listed, please consider follow up with your Primary Care Provider.   You have been scheduled for an endoscopy and colonoscopy. Please follow the written instructions given to you at your visit today. Please pick up your prep supplies at the pharmacy within the next 1-3 days. If you use inhalers (even only as needed), please bring them with you on the day of your procedure.   Please use Suprep that you have at home for preparation for colonoscopy/Endoscopy.   Thank you for choosing me and [facility].  Dr.Cieanna Stormes

## 2020-05-26 NOTE — Progress Notes (Signed)
Referring Provider: Laurey Morale, MD Primary Care Physician:  Laurey Morale, MD  Chief complaint:  Upper abdominal pain, change in bowel habits   IMPRESSION:  Upper abdominal pain Change in bowel habits - now with mucous and blood in the stool GERD controlled on omeprazole Large hiatal hernia on CT Parasite infection 2019 after a trip to [place] Hepatic steatosis on imaging Abnormal transaminases Serum glucose 124 Father with stomach cancer at age 66  Upper abdominal pain not explained by CT abd/pelvis with contrast, abdominal ultrasound, or stool studies (C diff, O&P, culture).  EGD recommended.  Change in bowel habits: Now with mucous and blood. Colonoscopy recommended.   Abnormal liver enzymes and hepatic steatosis on imaging: Suspected fatty liver. Proceed with labs to evaluate for common concurrent causes of hepatocellular inflammation and screen for insulin resistance.    PLAN: Continue omeprazole to 40 mg QAM EGD and colonoscopy for further evaluation Obtain records from  [place] for possible post-infection IBS after endoscopy if findings are negative   Please see the "Patient Instructions" section for addition details about the plan.  HPI: Gabriela Conley is a 54 y.o. female returns in follow-up for upper abdominal pain. She was last seen 06/04/19. The interval  history is obtained through the patient and review of her electronic health record. She completed the Covid vaccine in June. She continues to work from home.  She has GERD controlled on omeperazole and a hiatal hernia. Visited [place] in March 2019 and after she returned to [place] she developed similar symptoms to what she has now. She was diagnosed with an intestinal parasite infection, and she took several courses of medications before this resolved. She does not remember exactly what the parasite was.   Seen in the ED 04-01-19 for upper abdominal tenderness. Her labs showed a  normal WBC count, normal lipase, and mildly elevated transaminas es. A CT of the abdomen and pelvis showed a large hiatal hernia and some hepatic steatosis, but no acute changes. It was felt she had an enteritis, so she was given IV fluids and Zofran. At home she did not improve (although the fevers went away) so she returned to the ER on 04-05-19. That day she was switched from Omeprazole to Pepcid and given Compazine.   She was seen in consultation after that ED visit 06/04/19 and endoscopy was recommended but never scheduled.  She feels like her symptoms have worsened since her initial consultation. She continues to have bleeding 1-2 times a week. She has had some intermittent mucous in the stool but became concerned when she noted a big ball of mucous in her stool last week.  Some rectal pain with defecation.  Has a stinging, daily, near-constant "stinging" epigastric pain.  Associated nausea, post-prandial bloating, and early satiety. Pain worsened by defecation of hard stools.   She is taking a probiotic. Has tried local therapy.  She prefers to avoid medications and isn't really even using Aleve any more.    No prior upper endoscopy. Colonoscopy 3 years in [place] at [facility] for the evaluation of loose stools. Her intestines were "swollen" but they didn't tell her anything else.   Abdominal imaging: Abdominal ultrasound 05/03/19: echogenic liver, otherwise normal CT abd/pelvis with contrast 04/01/19: no acute findings, large hiatal hernia, hepatic steatosis CT abd/pelvis without contrast 05/21/13: large hiatal hernia  Recent labs: C Diff stool negative 04/09/19 Negative stool O&P 04/09/19 Negative stool culture 04/09/19 Normal CMP except for  ALT 55, glucose 124 04/05/19 Normal CBC with platelets 221 04/05/19 Elevated liver enzymes 11/11/19: AST 47, ALT 53, alk phos 84, TB 0.4 Normal CBC 11/11/19 with platelets of 248 Elevated liver enzymes 04/08/20: AST 34, ALT 50, alk phos 71, TB <0.1  Father with  stomach cancer at age 39. Mother had uterine cancer at age 22.  No known family history of colon cancer or polyps. No other family history of uterine/endometrial cancer, pancreatic cancer or gastric/stomach cancer.  Past Medical History:  Diagnosis Date  . Allergy   . Anxiety   . Asthma   . Diabetes mellitus without complication (HCC)    gestational   . GERD (gastroesophageal reflux disease)   . Hypothyroid     Past Surgical History:  Procedure Laterality Date  . BREAST SURGERY     removal of benign cyst   . CYSTECTOMY     removed from chest  . HERNIA REPAIR     umbilical   . TUBAL LIGATION      Current Outpatient Medications  Medication Sig Dispense Refill  . hydrocortisone (ANUSOL-HC) 2.5 % rectal cream Place 1 application rectally 2 (two) times daily. 30 g 0  . levothyroxine (SYNTHROID) 175 MCG tablet Take 1 tablet (175 mcg total) by mouth daily before breakfast. 90 tablet 3  . omeprazole (PRILOSEC) 40 MG capsule Take 1 capsule (40 mg total) by mouth daily. 90 capsule 3  . venlafaxine XR (EFFEXOR-XR) 150 MG 24 hr capsule TAKE 1 CAPSULE (150 MG TOTAL) BY MOUTH DAILY WITH BREAKFAST. 90 capsule 4   No current facility-administered medications for this visit.    Allergies as of 05/26/2020 - Review Complete 05/26/2020  Allergen Reaction Noted  . 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 Uterine cancer Mother   . Emphysema Mother   . Stomach cancer Father     Social History   Socioeconomic History  . Marital status: Single    Spouse name: Not on file  . Number of children: Not on file  . Years of education: Not on file  . Highest education level: Not on file  Occupational History  . Not on file  Tobacco Use  . Smoking status: Former Research scientist (life sciences)  . Smokeless tobacco: Never Used  Vaping Use  . Vaping Use: Never used  Substance and Sexual Activity  . Alcohol use: Yes    Comment: rare  . Drug use: Yes    Types: Marijuana    . Sexual activity: Not on file  Other Topics Concern  . Not on file  Social History Narrative  . Not on file   Social Determinants of Health   Financial Resource Strain:   . Difficulty of Paying Living Expenses:   Food Insecurity:   . Worried About Charity fundraiser in the Last Year:   . Arboriculturist in the Last Year:   Transportation Needs:   . Film/video editor (Medical):   Marland Kitchen Lack of Transportation (Non-Medical):   Physical Activity:   . Days of Exercise per Week:   . Minutes of Exercise per Session:   Stress:   . Feeling of Stress :   Social Connections:   . Frequency of Communication with Friends and Family:   . Frequency of Social Gatherings with Friends and Family:   . Attends Religious Services:   . Active Member of Clubs or Organizations:   . Attends Archivist Meetings:   .  Marital Status:   Intimate Partner Violence:   . Fear of Current or Ex-Partner:   . Emotionally Abused:   Marland Kitchen Physically Abused:   . Sexually Abused:      Physical Exam: General: Awake, alert, and oriented, and well communicative. In no acute distress.  HEENT: EOMI, non-icteric sclera, NCAT, MMM  Neck: Normal movement of head and neck  Pulm: No labored breathing, speaking in full sentences without conversational dyspnea  Abd: soft, NT, ND, I am unable to reproduce her abdominal pain Derm: No apparent lesions or bruising in visible field  MS: Moves all visible extremities without noticeable abnormality  Psych: Pleasant, cooperative, normal speech, normal affect and normal insight Neuro: Alert and appropriate   Ariatna Jester L. Tarri Glenn, MD, MPH [facility] 05/26/2020, 9:11 AM

## 2020-05-28 ENCOUNTER — Encounter: Payer: Self-pay | Admitting: Gastroenterology

## 2020-07-20 ENCOUNTER — Ambulatory Visit (AMBULATORY_SURGERY_CENTER): Payer: 59 | Admitting: Gastroenterology

## 2020-07-20 ENCOUNTER — Other Ambulatory Visit: Payer: Self-pay

## 2020-07-20 ENCOUNTER — Encounter: Payer: Self-pay | Admitting: Gastroenterology

## 2020-07-20 VITALS — BP 124/72 | HR 68 | Temp 97.3°F | Resp 12 | Ht 62.5 in | Wt 199.0 lb

## 2020-07-20 DIAGNOSIS — R194 Change in bowel habit: Secondary | ICD-10-CM | POA: Diagnosis not present

## 2020-07-20 DIAGNOSIS — K449 Diaphragmatic hernia without obstruction or gangrene: Secondary | ICD-10-CM | POA: Diagnosis not present

## 2020-07-20 DIAGNOSIS — K625 Hemorrhage of anus and rectum: Secondary | ICD-10-CM

## 2020-07-20 DIAGNOSIS — R1013 Epigastric pain: Secondary | ICD-10-CM

## 2020-07-20 DIAGNOSIS — K621 Rectal polyp: Secondary | ICD-10-CM

## 2020-07-20 DIAGNOSIS — K649 Unspecified hemorrhoids: Secondary | ICD-10-CM

## 2020-07-20 DIAGNOSIS — D124 Benign neoplasm of descending colon: Secondary | ICD-10-CM | POA: Diagnosis not present

## 2020-07-20 DIAGNOSIS — D128 Benign neoplasm of rectum: Secondary | ICD-10-CM

## 2020-07-20 DIAGNOSIS — K2950 Unspecified chronic gastritis without bleeding: Secondary | ICD-10-CM | POA: Diagnosis not present

## 2020-07-20 HISTORY — PX: ESOPHAGOGASTRODUODENOSCOPY: SHX1529

## 2020-07-20 HISTORY — PX: COLONOSCOPY: SHX174

## 2020-07-20 MED ORDER — SODIUM CHLORIDE 0.9 % IV SOLN: 500.0000 mL | Freq: Once | INTRAVENOUS | Status: DC

## 2020-07-20 NOTE — Progress Notes (Signed)
A and O x3. Report to RN. Tolerated MAC anesthesia well.Teeth unchanged after procedure.

## 2020-07-20 NOTE — Op Note (Addendum)
[facility] Patient Name: Gabriela Conley Procedure Date: 07/20/2020 2:02 PM MRN: [id number] Endoscopist: Thornton Park MD, MD Age: 54 Referring MD:  Date of Birth: 16-Mar-1966 Gender: Female Account #: [id number] Procedure:                Upper GI endoscopy Indications:              Upper abdominal pain, Change in bowel habits                           Father with stomach cancer at age 35 Medicines:                Monitored Anesthesia Care Procedure:                Pre-Anesthesia Assessment:                           - Prior to the procedure, a History and Physical                            was performed, and patient medications and                            allergies were reviewed. The patient's tolerance of                            previous anesthesia was also reviewed. The risks                            and benefits of the procedure and the sedation                            options and risks were discussed with the patient.                            All questions were answered, and informed consent                            was obtained. Prior Anticoagulants: The patient has                            taken no previous anticoagulant or antiplatelet                            agents. ASA Grade Assessment: II - A patient with                            mild systemic disease. After reviewing the risks                            and benefits, the patient was deemed in                            satisfactory condition to undergo the procedure.  After obtaining informed consent, the endoscope was                            passed under direct vision. Throughout the                            procedure, the patient's blood pressure, pulse, and                            oxygen saturations were monitored continuously. The                            Endoscope was introduced through the mouth, and                            advanced to the third  part of duodenum. The upper                            GI endoscopy was accomplished without difficulty.                            The patient tolerated the procedure well. Scope In: Scope Out: Findings:                 The examined esophagus was normal. Prominent veins                            of unclear clinical significance noted in the upper                            esophagus.                           A large hiatal hernia was present.                           The entire examined stomach was normal. Biopsies                            were taken from the antrum, body, and fundus with a                            cold forceps for histology. Estimated blood loss                            was minimal.                           The examined duodenum was normal. Biopsies were                            taken with a cold forceps for histology. Estimated                            blood loss was minimal.  The cardia and gastric fundus were normal on                            retroflexion.                           The exam was otherwise without abnormality. Complications:            No immediate complications. Estimated blood loss:                            Minimal. Estimated Blood Loss:     Estimated blood loss was minimal. Impression:               - Normal esophagus.                           - Large hiatal hernia.                           - Normal stomach. Biopsied.                           - Normal examined duodenum. Biopsied.                           - The examination was otherwise normal. Recommendation:           - Patient has a contact number available for                            emergencies. The signs and symptoms of potential                            delayed complications were discussed with the                            patient. Return to normal activities tomorrow.                            Written discharge instructions were  provided to the                            patient.                           - Resume previous diet.                           - Continue present medications.                           - Await pathology results.                           - Proceed with colonoscopy as previously planned. Thornton Park MD, MD 07/20/2020 2:45:23 PM This report has been signed electronically.

## 2020-07-20 NOTE — Patient Instructions (Addendum)
Resume previous diet.  Handout given on polyps and hemorrhoids.  Await pathology results: Polyps, Colon Biopsies, Stomach Biopsies.  YOU HAD AN ENDOSCOPIC PROCEDURE TODAY AT [facility]:   Refer to the procedure report that was given to you for any specific questions about what was found during the examination.  If the procedure report does not answer your questions, please call your gastroenterologist to clarify.  If you requested that your care partner not be given the details of your procedure findings, then the procedure report has been included in a sealed envelope for you to review at your convenience later.  YOU SHOULD EXPECT: Some feelings of bloating in the abdomen. Passage of more gas than usual.  Walking can help get rid of the air that was put into your GI tract during the procedure and reduce the bloating. If you had a lower endoscopy (such as a colonoscopy or flexible sigmoidoscopy) you may notice spotting of blood in your stool or on the toilet paper. If you underwent a bowel prep for your procedure, you may not have a normal bowel movement for a few days.  Please Note:  You might notice some irritation and congestion in your nose or some drainage.  This is from the oxygen used during your procedure.  There is no need for concern and it should clear up in a day or so.  SYMPTOMS TO REPORT IMMEDIATELY:   Following lower endoscopy (colonoscopy or flexible sigmoidoscopy):  Excessive amounts of blood in the stool  Significant tenderness or worsening of abdominal pains  Swelling of the abdomen that is new, acute  Fever of 100F or higher   Following upper endoscopy (EGD)  Vomiting of blood or coffee ground material  New chest pain or pain under the shoulder blades  Painful or persistently difficult swallowing  New shortness of breath  Fever of 100F or higher  Black, tarry-looking stools  For urgent or emergent issues, a gastroenterologist can be reached at  any hour by calling [phone]. Do not use MyChart messaging for urgent concerns.    DIET:  We do recommend a small meal at first, but then you may proceed to your regular diet.  Drink plenty of fluids but you should avoid alcoholic beverages for 24 hours.  ACTIVITY:  You should plan to take it easy for the rest of today and you should NOT DRIVE or use heavy machinery until tomorrow (because of the sedation medicines used during the test).    FOLLOW UP: Our staff will call the number listed on your records 48-72 hours following your procedure to check on you and address any questions or concerns that you may have regarding the information given to you following your procedure. If we do not reach you, we will leave a message.  We will attempt to reach you two times.  During this call, we will ask if you have developed any symptoms of COVID 19. If you develop any symptoms (ie: fever, flu-like symptoms, shortness of breath, cough etc.) before then, please call [phone].  If you test positive for Covid 19 in the 2 weeks post procedure, please call and report this information to [place].    If any biopsies were taken you will be contacted by phone or by letter within the next 1-3 weeks.  Please call us at [phone] if you have not heard about the biopsies in 3 weeks.    SIGNATURES/CONFIDENTIALITY: You and/or your care partner have signed paperwork which will  be entered into your electronic medical record.  These signatures attest to the fact that that the information above on your After Visit Summary has been reviewed and is understood.  Full responsibility of the confidentiality of this discharge information lies with you and/or your care-partner. 

## 2020-07-20 NOTE — Progress Notes (Signed)
Vitals-JK  History reviewed.

## 2020-07-20 NOTE — Progress Notes (Signed)
Called to room to assist during endoscopic procedure.  Patient ID and intended procedure confirmed with present staff. Received instructions for my participation in the procedure from the performing physician.  

## 2020-07-20 NOTE — Op Note (Signed)
[place] Patient Name: Gabriela Conley Procedure Date: 07/20/2020 2:02 PM MRN: [id number] Endoscopist: Thornton Park MD, MD Age: 54 Referring MD:  Date of Birth: 1966-04-08 Gender: Female Account #: [id number] Procedure:                Colonoscopy Indications:              Change in bowel habits - now with mucous and blood                            in the stool                           Parasite infection 2019 after a trip to [place] Medicines:                Monitored Anesthesia Care Procedure:                Pre-Anesthesia Assessment:                           - Prior to the procedure, a History and Physical                            was performed, and patient medications and                            allergies were reviewed. The patient's tolerance of                            previous anesthesia was also reviewed. The risks                            and benefits of the procedure and the sedation                            options and risks were discussed with the patient.                            All questions were answered, and informed consent                            was obtained. Prior Anticoagulants: The patient has                            taken no previous anticoagulant or antiplatelet                            agents. ASA Grade Assessment: II - A patient with                            mild systemic disease. After reviewing the risks                            and benefits, the patient was deemed in  satisfactory condition to undergo the procedure.                           After obtaining informed consent, the colonoscope                            was passed under direct vision. Throughout the                            procedure, the patient's blood pressure, pulse, and                            oxygen saturations were monitored continuously. The                            Colonoscope was introduced through the anus and                             advanced to the 3 cm into the ileum. The                            colonoscopy was performed without difficulty. The                            patient tolerated the procedure well. The quality                            of the bowel preparation was good. The terminal                            ileum, ileocecal valve, appendiceal orifice, and                            rectum were photographed. Scope In: 2:17:38 PM Scope Out: 2:37:24 PM Scope Withdrawal Time: 0 hours 17 minutes 48 seconds  Total Procedure Duration: 0 hours 19 minutes 46 seconds  Findings:                 The perianal and digital rectal examinations were                            normal.                           A 2 mm polyp was found in the rectum. The polyp was                            flat. The polyp was removed with a cold biopsy                            forceps. Resection and retrieval were complete.                            Estimated blood loss was minimal.  A 3 mm polyp was found in the distal descending                            colon. The polyp was sessile. The polyp was removed                            with a cold snare. Resection and retrieval were                            complete. Estimated blood loss was minimal.                           A 1 mm polyp was found in the proximal descending                            colon. The polyp was flat. The polyp was removed                            with a cold biopsy forceps. Resection and retrieval                            were complete. Estimated blood loss was minimal.                           The colon (entire examined portion) appeared                            normal. Biopsies were taken with a cold forceps for                            histology. Biopsies for histology were taken with a                            cold forceps from the right colon and left colon                            for  evaluation of microscopic colitis. Estimated                            blood loss was minimal. The examined terminal ileum                            appeared normal.                           Non-bleeding internal hemorrhoids were found. The                            hemorrhoids were small.                           The exam was otherwise without abnormality on  direct and retroflexion views. Complications:            No immediate complications. Estimated blood loss:                            Minimal. Estimated Blood Loss:     Estimated blood loss was minimal. Impression:               - One 2 mm polyp in the rectum, removed with a cold                            biopsy forceps. Resected and retrieved.                           - One 3 mm polyp in the distal descending colon,                            removed with a cold snare. Resected and retrieved.                           - One 1 mm polyp in the proximal descending colon,                            removed with a cold biopsy forceps. Resected and                            retrieved.                           - The entire examined colon is normal. Biopsied                            given her recent symptoms.                           - Non-bleeding internal hemorrhoids.                           - The examination was otherwise normal on direct                            and retroflexion views. Recommendation:           - Patient has a contact number available for                            emergencies. The signs and symptoms of potential                            delayed complications were discussed with the                            patient. Return to normal activities tomorrow.                            Written discharge instructions were  provided to the                            patient.                           - Resume previous diet.                           - Continue present  medications.                           - Await pathology results.                           - Repeat colonoscopy date to be determined after                            pending pathology results are reviewed for                            surveillance.                           - Emerging evidence supports eating a diet of                            fruits, vegetables, grains, calcium, and yogurt                            while reducing red meat and alcohol may reduce the                            risk of colon cancer.                           - Thank you for allowing me to be involved in your                            colon cancer prevention. Thornton Park MD, MD 07/20/2020 2:55:28 PM This report has been signed electronically.

## 2020-07-20 NOTE — Progress Notes (Signed)
Robinol antisialogogue. Lidocaine buffer

## 2020-07-24 ENCOUNTER — Telehealth: Payer: Self-pay

## 2020-07-24 NOTE — Telephone Encounter (Signed)
Left message on follow up call. 

## 2020-07-24 NOTE — Telephone Encounter (Signed)
  Follow up Call-  Call back number 07/20/2020  Post procedure Call Back phone  # [phone]  Permission to leave phone message Yes  Some recent data might be hidden     Patient questions:  Do you have a fever, pain , or abdominal swelling? No. Pain Score  0 *  Have you tolerated food without any problems? Yes.    Have you been able to return to your normal activities? Yes.    Do you have any questions about your discharge instructions: Diet   No. Medications  No. Follow up visit  No.  Do you have questions or concerns about your Care? No.  Actions: * If pain score is 4 or above: No action needed, pain <4.   1. Have you developed a fever since your procedure? No   2.   Have you had an respiratory symptoms (SOB or cough) since your procedure? No   3.   Have you tested positive for COVID 19 since your procedure? No   4.   Have you had any family members/close contacts diagnosed with the COVID 19 since your procedure?  No    If yes to any of these questions please route to Joylene John, RN and Joella Prince, RN

## 2020-07-31 ENCOUNTER — Other Ambulatory Visit: Payer: Self-pay

## 2020-07-31 MED ORDER — RIFAXIMIN 550 MG PO TABS
550.0000 mg | ORAL_TABLET | Freq: Three times a day (TID) | ORAL | 0 refills | Status: DC
Start: 2020-07-31 — End: 2020-09-05

## 2020-08-02 ENCOUNTER — Telehealth: Payer: Self-pay

## 2020-08-02 NOTE — Telephone Encounter (Signed)
Gabriela Conley (Key: [id number]) Rx #: [id number] Xifaxan 550MG  tablets  Request Reference Number: [id number]. XIFAXAN TAB 550MG  is approved through 09/01/2020. Your patient may now fill this prescription and it will be covered.

## 2020-08-23 IMAGING — CT CT ABDOMEN AND PELVIS WITH CONTRAST
2 of 5 series · 16 of 46 positions shown, 18 images · IV contrast (APPLIED)
Comparison: 05/21/2013

CLINICAL DATA: Nausea, dehydration, history of hiatal hernia

EXAM:
CT ABDOMEN AND PELVIS WITH CONTRAST
TECHNIQUE: Multidetector CT imaging of the abdomen and pelvis was performed
using the standard protocol following bolus administration of
intravenous contrast.
CONTRAST:  100mL OMNIPAQUE IOHEXOL 300 MG/ML  SOLN

[Series 3: abdomen 5.0 · axial · 0.71mm/px · z∈[+702,+1122]mm · 13 of 98 slices shown, 15 images]
[im 7/98  soft-tissue]
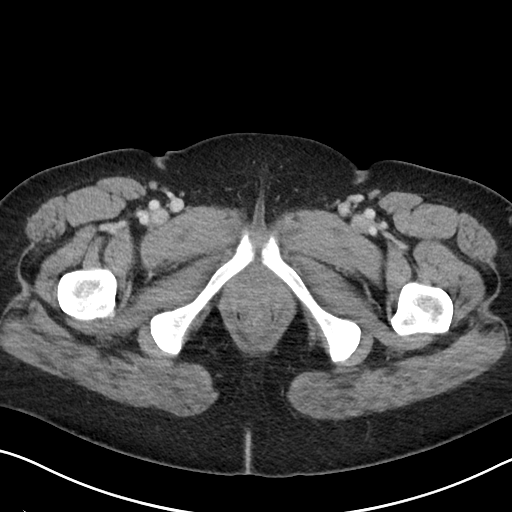
[im 7/98  bone]
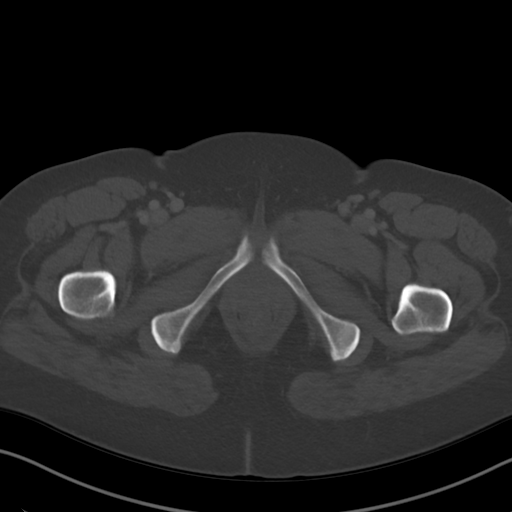
[im 13/98  soft-tissue]
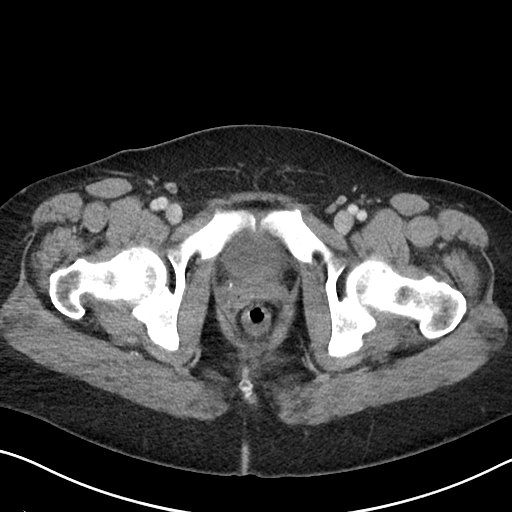
[im 19/98  soft-tissue]
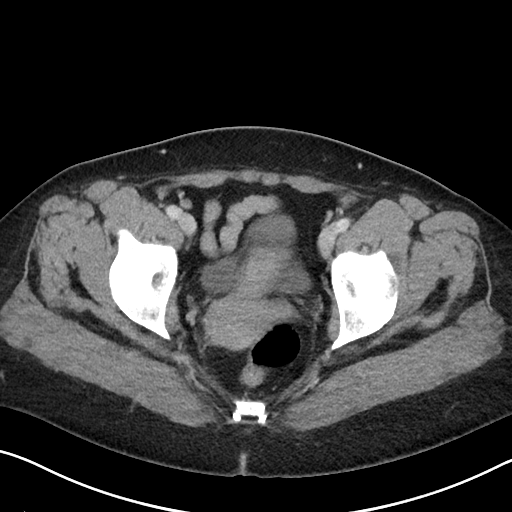
[im 31/98  soft-tissue]
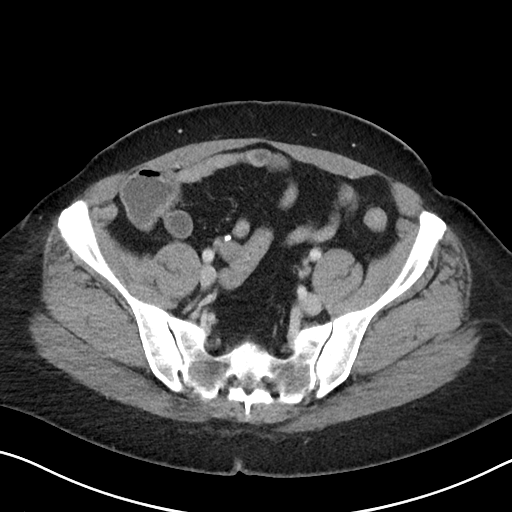
[im 37/98  soft-tissue]
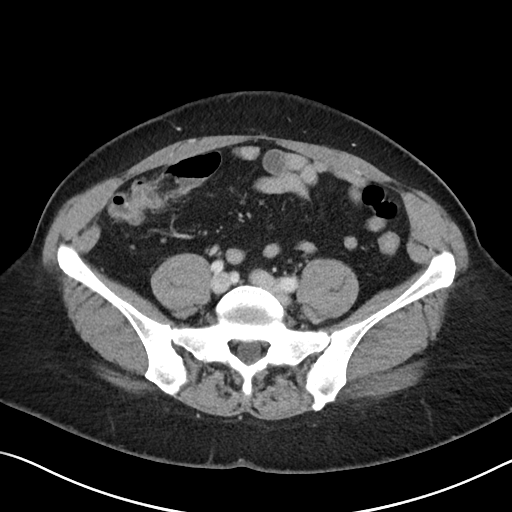
[im 43/98  soft-tissue]
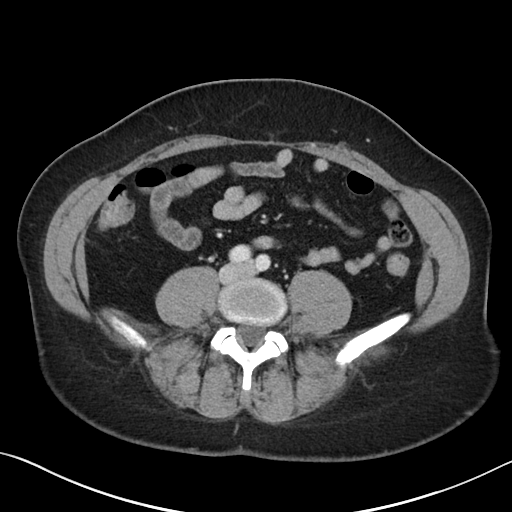
[im 49/98  soft-tissue]
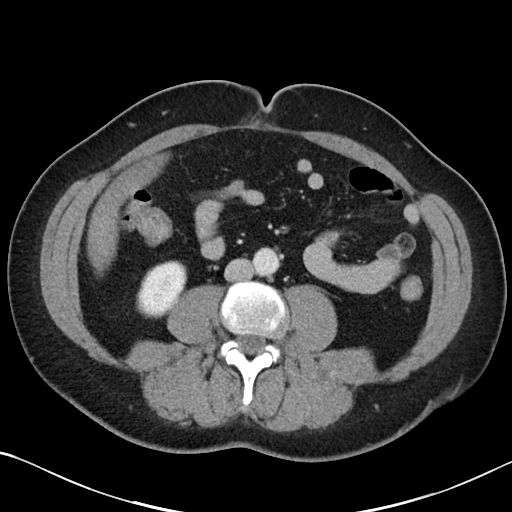
[im 55/98  soft-tissue]
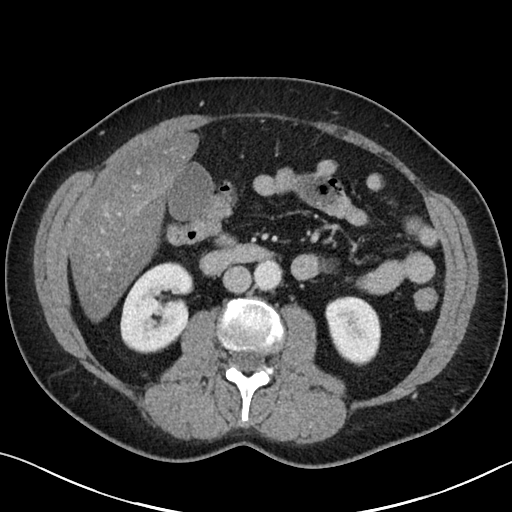
[im 61/98  soft-tissue]
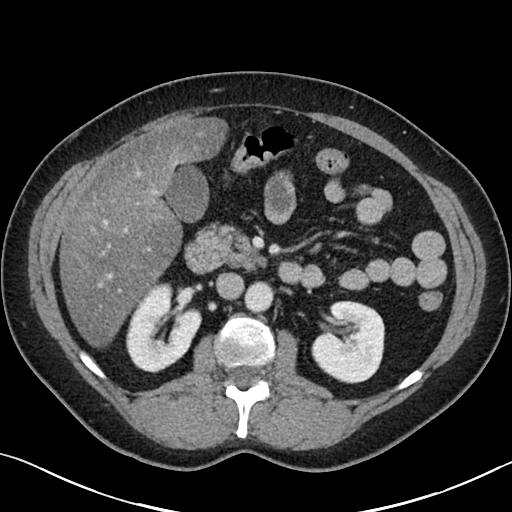
[im 61/98  bone]
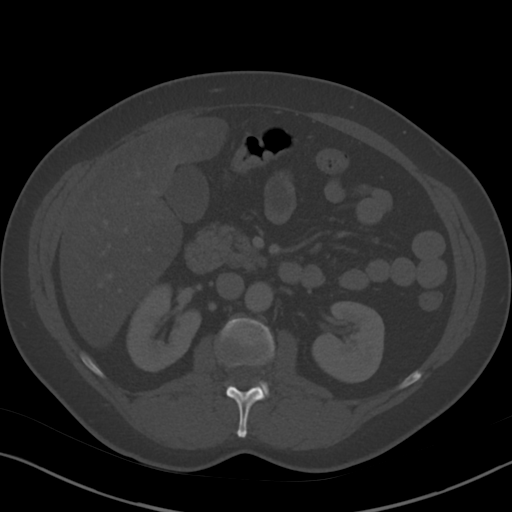
[im 67/98  soft-tissue]
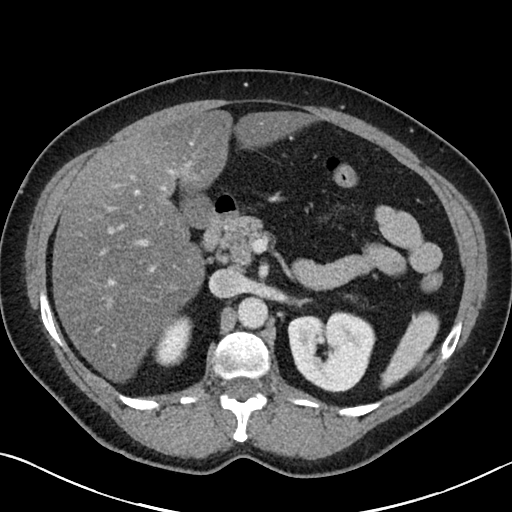
[im 79/98  soft-tissue]
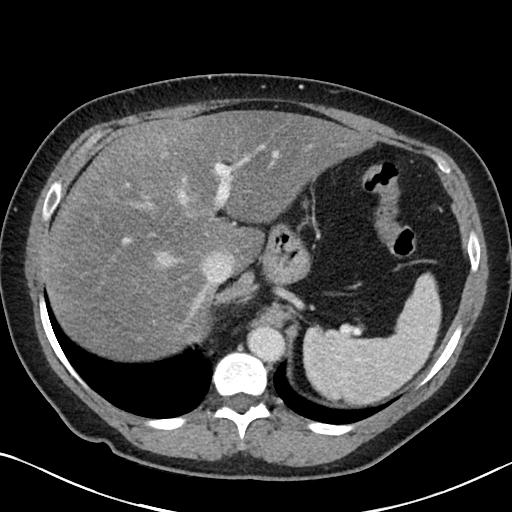
[im 85/98  soft-tissue]
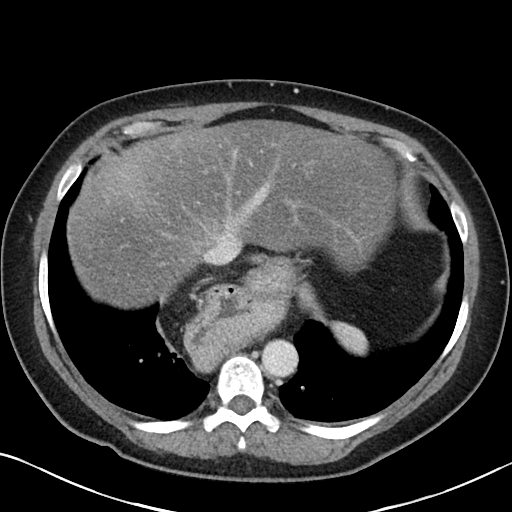
[im 91/98  soft-tissue]
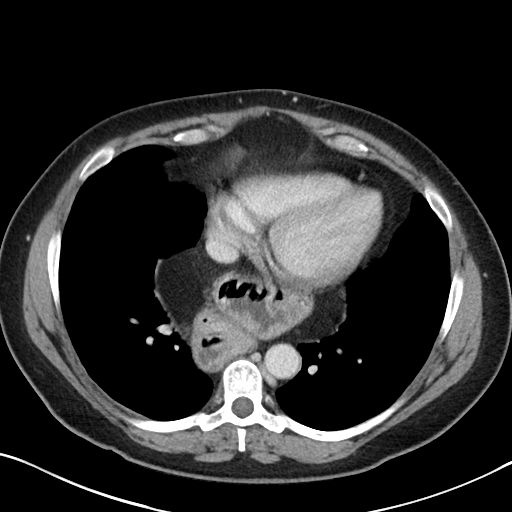

[Series 6: abdomen 3.0 mpr cor · coronal · 0.73mm/px · 3 of 94 slices shown]
[im 32/94  soft-tissue]
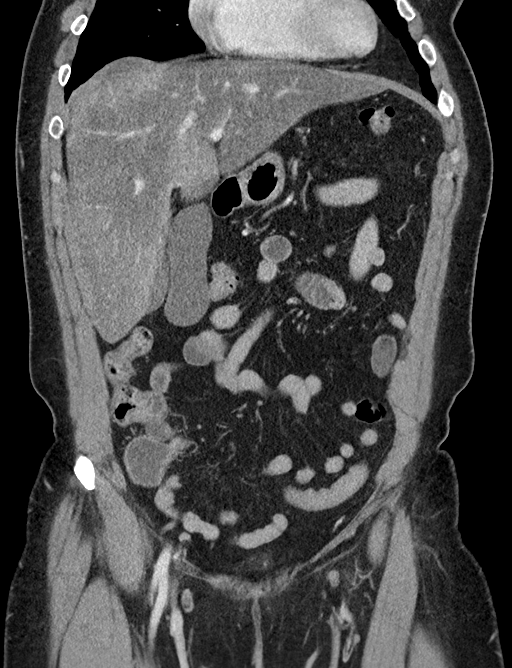
[im 42/94  soft-tissue]
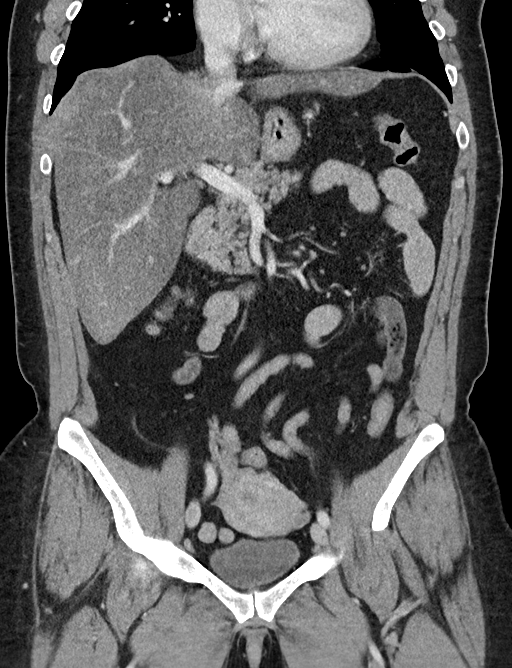
[im 52/94  soft-tissue]
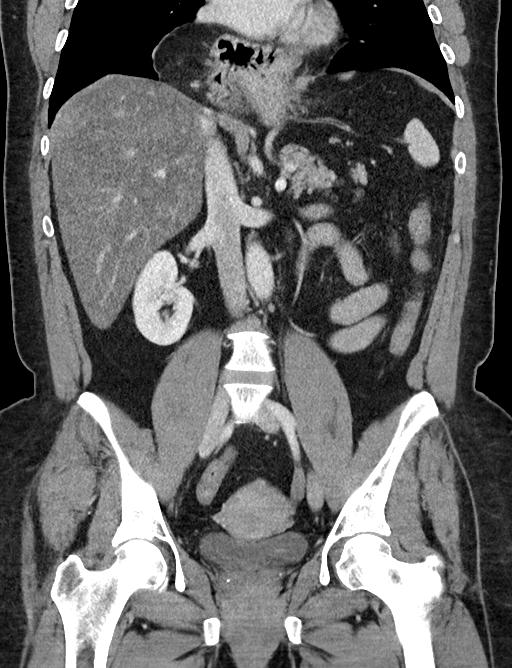

[16 of 46 positions shown; findings below may reference images not displayed]

FINDINGS: Lower chest: No acute abnormality.  Large hiatal hernia.

Hepatobiliary: Hepatic steatosis. No gallstones, gallbladder wall
thickening, or biliary dilatation.

Pancreas: Unremarkable. No pancreatic ductal dilatation or
surrounding inflammatory changes.

Spleen: Normal in size without focal abnormality.

Adrenals/Urinary Tract: Adrenal glands are unremarkable. Kidneys are
normal, without renal calculi, focal lesion, or hydronephrosis.
Bladder is unremarkable.

Stomach/Bowel: Stomach is within normal limits. Appendix appears
normal. No evidence of bowel wall thickening, distention, or
inflammatory changes.

Vascular/Lymphatic: No significant vascular findings are present. No
enlarged abdominal or pelvic lymph nodes.

Reproductive: No mass or other abnormality.

Other: No abdominal wall hernia or abnormality. No abdominopelvic
ascites.

Musculoskeletal: No acute or significant osseous findings.
IMPRESSION: 1. No acute CT findings of the abdomen or pelvis to explain nausea
or vomiting.

2.  Hepatic steatosis.

3.  Large hiatal hernia.

## 2020-09-05 ENCOUNTER — Other Ambulatory Visit: Payer: 59

## 2020-09-05 ENCOUNTER — Ambulatory Visit: Payer: 59 | Admitting: Gastroenterology

## 2020-09-05 ENCOUNTER — Encounter: Payer: Self-pay | Admitting: Gastroenterology

## 2020-09-05 ENCOUNTER — Telehealth: Payer: Self-pay

## 2020-09-05 VITALS — BP 130/82 | HR 66 | Ht 62.0 in | Wt 197.0 lb

## 2020-09-05 DIAGNOSIS — R1013 Epigastric pain: Secondary | ICD-10-CM

## 2020-09-05 DIAGNOSIS — R194 Change in bowel habit: Secondary | ICD-10-CM

## 2020-09-05 DIAGNOSIS — R140 Abdominal distension (gaseous): Secondary | ICD-10-CM

## 2020-09-05 DIAGNOSIS — K449 Diaphragmatic hernia without obstruction or gangrene: Secondary | ICD-10-CM

## 2020-09-05 MED ORDER — FDGARD 25-20.75 MG PO CAPS: ORAL_CAPSULE | ORAL | 0 refills | Status: DC

## 2020-09-05 NOTE — Patient Instructions (Addendum)
I would like to check you for bacterial overgrowth in your small intestines. This involves a breath test. Based on those results, we can tailor any future antibiotics.  SIBO: You have been given a testing kit to check for small intestine bacterial overgrowth (SIBO) which is completed by a company named [facility]. Make sure to return your test in the mail using the return mailing label given to you along with the kit. Your demographic and insurance information have already been sent to the company and they should be in contact with you over the next week regarding this test. [facility] will collect an upfront charge of $99.74 for commercial insurance plans and $209.74 is you are paying cash. Make sure to discuss with [facility] PRIOR to having the test if they have gotten informatoin from your insurance company as to how much your testing will cost out of pocket, if any. Please keep in mind that you will be getting a call from phone number [phone] or a similar number. If you do not hear from them within this time frame, please call our office at [phone].   Given the way that red meat has triggered your symptoms, I recommend that we formally test you for mammalian meat allergies. This requires a blood test.  I find that carbonated beverages and sugar substitutes can cause symptoms similar to what you've been experiencing. Remove them from your diet for a couple of weeks and let's see if you feel better.   Given your history of colon polyps removed on your colonoscopy, you should have another colonoscopy in 7 years to be sure that you don't grow any more.   PRESCRIPTION MEDICATION(S): Please continue the following medication as directed:  Marland Kitchen Omeprazole 45m by mouth every morning   SAMPLES: We have given you samples of the following medication to take:  FD GDonald Prose- While we are waiting on results, I am recommending a trial of FDGard - 2 capsules taken twice daily for 4 weeks.   Please take FDGard 30 to 60 minutes before meals with water.  There is no distinct pattern of side effects with FDGard, but, sometimes patients experience a mild tingling sensation in the gut within the first 30 minutes. This sensation should subsequently subside.   LABS: Your provider has requested that you go to the basement level for lab work before leaving today. Press "B" on the elevator. The lab is located at the first door on the left as you exit the elevator.  HEALTHCARE LAWS AND MY CHART RESULTS: Due to recent changes in healthcare laws, you may see the results of your imaging and laboratory studies on MyChart before your provider has had a chance to review them.  We understand that in some cases there may be results that are confusing or concerning to you. Not all laboratory results come back in the same time frame and the provider may be waiting for multiple results in order to interpret others.  Please give [id number] hours in order for your provider to thoroughly review all the results before contacting the office for clarification of your results.   If you are age 481or younger, your body mass index should be between 19-25. Your Body mass index is 36.03 kg/m. If this is out of the aformentioned range listed, please consider follow up with your Primary Care Provider.   Please follow up with me in 6-8 weeks as scheduled  Thank you for trusting me with your gastrointestinal care!    KJoelene Millin  Tarri Glenn, MD, MPH

## 2020-09-05 NOTE — Progress Notes (Signed)
Referring Provider: Laurey Morale, MD Primary Care Physician:  Laurey Morale, MD  Chief complaint:  Upper abdominal pain, change in bowel habits   IMPRESSION:  Upper abdominal pain with nausea, postprandial bloating, and early satiety Change in bowel habits - recently with mucous and blood in the stool GERD controlled on omeprazole Large hiatal hernia on CT Parasite infection 2019 after a trip to [place] Hepatic steatosis on imaging Abnormal transaminases Serum glucose 124 Father with stomach cancer at age 48  Upper abdominal pain with associated nausea, postprandial bloating, and early satiety: Not explained by CT abd/pelvis with contrast, abdominal ultrasound, stool studies (C diff, O&P, culture), or EGD.  Given her response to Xifaxan this may be SIBO or functional dyspepsia.  Must exclude alpha gal given the relationship to her symptoms with meat consumption.  Abnormal liver enzymes and hepatic steatosis on imaging: Suspected fatty liver. Proceed with labs to evaluate for common concurrent causes of hepatocellular inflammation and screen for insulin resistance.    PLAN: Continue omeprazole to 40 mg QAM Meat allergy testing Glucose small intestinal bacterial overgrowth breath test Dietary recommendations to avoid carbonation and sugar substitutes Samples of FDGard offered today Follow-up in 6-8 weeks Surveillance colonoscopy in 2028   Please see the "Patient Instructions" section for addition details about the plan.  HPI: Gabriela Conley is a 54 y.o. female returns in follow-up for upper abdominal pain.   She has GERD controlled on omeperazole and a hiatal hernia. Visited [place] in March 2019 and after she returned to [place] she developed similar symptoms to what she has now. She was diagnosed with an intestinal parasite infection, and she took several courses of medications before this resolved. She does not remember exactly what the parasite was.   Colonoscopy 3 years  in [place] at [facility] for the evaluation of loose stools. Her intestines were "swollen" but they didn't tell her anything else.   Seen in the ED 04-01-19 for upper abdominal tenderness. Her labs showed a normal WBC count, normal lipase, and mildly elevated transaminas es. A CT of the abdomen and pelvis showed a large hiatal hernia and some hepatic steatosis, but no acute changes. It was felt she had an enteritis, so she was given IV fluids and Zofran. At home she did not improve (although the fevers went away) so she returned to the ER on 04-05-19. That day she was switched from Omeprazole to Pepcid and given Compazine.   She was seen in consultation after that ED visit 06/04/19 and endoscopy was recommended but never scheduled.  On follow-up 05/26/2020 her symptoms had worsened since her initial consultation.  She continued to have a near constant, daily, stinging epigastric pain with associated nausea, postprandial bloating, and early satiety.  She continued to have rectal bleeding bleeding 1-2 times a week, intermittent mucus in the stool, and some rectal pain with defecation.  Abdominal pain worsened by defecation of hard stools.   Endoscopic evaluation was performed 07/20/2020. EGD showed a large hiatal hernia, H. pylori negative gastritis.  Colonoscopy revealed 2 small tubular adenomas, a small hyperplastic polyp, and nonbleeding internal hemorrhoids.  Right and left sided colon biopsies were negative for microscopic colitis.  After the endoscopy, her symptoms improved while taking Xifaxan 550 mg 3 times daily for 14 days. Within one week of completing the antibiotics all of her symptoms recurred including epigastric fullness, nausea, postprandial bloating, and early satiety.  No further bleeding.   She has identified red meat as  a trigger. No other identified triggers; in particular carbs do not trigger her symptoms. No dietary concerns with dairy. Appetite fluctuates. Weight is overall stable but  she feels like it fluctuates.   Uses an Olly multivitamin that has a probiotic in it daily. She prefers to avoid medications. No NSAIDS.   Abdominal imaging: Abdominal ultrasound 05/03/19: echogenic liver, otherwise normal CT abd/pelvis with contrast 04/01/19: no acute findings, large hiatal hernia, hepatic steatosis CT abd/pelvis without contrast 05/21/13: large hiatal hernia  Recent labs: C Diff stool negative 04/09/19 Negative stool O&P 04/09/19 Negative stool culture 04/09/19 Normal CMP except for ALT 55, glucose 124 04/05/19 Normal CBC with platelets 221 04/05/19 Elevated liver enzymes 11/11/19: AST 47, ALT 53, alk phos 84, TB 0.4 Normal CBC 11/11/19 with platelets of 248 Elevated liver enzymes 04/08/20: AST 34, ALT 50, alk phos 71, TB <0.1  Endoscopic history Colonoscopy in [place] 4 years ago EGD 07/20/2020: large hiatal hernia, H. pylori negative gastritis Colonoscopy 07/20/20: 2 small tubular adenomas, a small hyperplastic polyp, and nonbleeding internal hemorrhoids.  Right and left sided colon biopsies were negative for microscopic colitis.  Father with stomach cancer at age 55. Mother had uterine cancer at age 74.  No known family history of colon cancer or polyps. No other family history of uterine/endometrial cancer, pancreatic cancer or gastric/stomach cancer.  Past Medical History:  Diagnosis Date   Allergy    Anxiety    Asthma    Diabetes mellitus without complication ([place])    gestational    GERD (gastroesophageal reflux disease)    Hypothyroid     Past Surgical History:  Procedure Laterality Date   BREAST SURGERY     removal of benign cyst    CYSTECTOMY     removed from chest   HERNIA REPAIR     umbilical    TUBAL LIGATION      Current Outpatient Medications  Medication Sig Dispense Refill   hydrocortisone (ANUSOL-HC) 2.5 % rectal cream Place 1 application rectally 2 (two) times daily. 30 g 0   levothyroxine (SYNTHROID) 175 MCG tablet Take 1 tablet (175 mcg  total) by mouth daily before breakfast. 90 tablet 3   omeprazole (PRILOSEC) 40 MG capsule Take 1 capsule (40 mg total) by mouth daily. 90 capsule 3   venlafaxine XR (EFFEXOR-XR) 150 MG 24 hr capsule TAKE 1 CAPSULE (150 MG TOTAL) BY MOUTH DAILY WITH BREAKFAST. 90 capsule 4   No current facility-administered medications for this visit.    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    Family History  Problem Relation Age of Onset   Uterine cancer Mother    Emphysema Mother    Pancreatic cancer Father    Heart disease Brother    Diabetes Maternal Uncle    Colon cancer Neg Hx    Esophageal cancer Neg Hx     Social History   Socioeconomic History   Marital status: Single    Spouse name: Not on file   Number of children: Not on file   Years of education: Not on file   Highest education level: Not on file  Occupational History   Not on file  Tobacco Use   Smoking status: Former Smoker   Smokeless tobacco: Never Used  Scientific laboratory technician Use: Never used  Substance and Sexual Activity   Alcohol use: Yes    Comment: rare   Drug use: Yes  Types: Marijuana   Sexual activity: Not on file  Other Topics Concern   Not on file  Social History Narrative   Not on file   Social Determinants of Health   Financial Resource Strain:    Difficulty of Paying Living Expenses: Not on file  Food Insecurity:    Worried About [place] in the Last Year: Not on file   Ran Out of Food in the Last Year: Not on file  Transportation Needs:    Lack of Transportation (Medical): Not on file   Lack of Transportation (Non-Medical): Not on file  Physical Activity:    Days of Exercise per Week: Not on file   Minutes of Exercise per Session: Not on file  Stress:    Feeling of Stress : Not on file  Social Connections:    Frequency of Communication with Friends and Family: Not on  file   Frequency of Social Gatherings with Friends and Family: Not on file   Attends Religious Services: Not on file   Active Member of Clubs or Organizations: Not on file   Attends Archivist Meetings: Not on file   Marital Status: Not on file  Intimate Partner Violence:    Fear of Current or Ex-Partner: Not on file   Emotionally Abused: Not on file   Physically Abused: Not on file   Sexually Abused: Not on file     Physical Exam: General: Awake, alert, and oriented, and well communicative. In no acute distress.  HEENT: EOMI, non-icteric sclera, NCAT, MMM  Neck: Normal movement of head and neck  Pulm: No labored breathing, speaking in full sentences without conversational dyspnea  Abd: soft, NT, ND, I am unable to reproduce her abdominal pain Derm: No apparent lesions or bruising in visible field  MS: Moves all visible extremities without noticeable abnormality  Psych: Pleasant, cooperative, normal speech, normal affect and normal insight Neuro: Alert and appropriate   Gio Janoski L. Tarri Glenn, MD, MPH [facility] 09/05/2020, 8:30 AM

## 2020-09-05 NOTE — Telephone Encounter (Signed)
FAXED [place]: Order for SIBO w/ Glucose breath test  Other records requested: Insurance card and [employer]  All above requested information has been faxed successfully to [employer] listed above. Documents and fax confirmation have been placed in the faxed file for future reference.

## 2020-09-07 LAB — ALLERGEN PROFILE, FOOD-MEAT
Beef IgE: <0.1 kU/L
Chicken IgE: <0.1 kU/L
Pork IgE: <0.1 kU/L

## 2020-09-18 ENCOUNTER — Telehealth: Payer: Self-pay

## 2020-09-18 NOTE — Telephone Encounter (Signed)
Called [facility] to follow up on status of results and or receipt of kit from pt. States kit has not been received, no results to release. Called pt to follow up. States she was waiting on the company to call her about the test. Advised she was given the kit and instructions at her last office visit for her to complete. Advised she complete and submit the test kit to the company. Verbalized acceptance and understanding. 

## 2020-10-05 ENCOUNTER — Encounter: Payer: 59 | Admitting: Family Medicine

## 2020-10-05 ENCOUNTER — Encounter: Payer: Self-pay | Admitting: Family Medicine

## 2020-10-05 ENCOUNTER — Other Ambulatory Visit: Payer: Self-pay

## 2020-10-05 ENCOUNTER — Ambulatory Visit (INDEPENDENT_AMBULATORY_CARE_PROVIDER_SITE_OTHER): Payer: 59 | Admitting: Family Medicine

## 2020-10-05 VITALS — BP 157/88 | HR 74 | Ht 62.0 in | Wt 197.0 lb

## 2020-10-05 DIAGNOSIS — E039 Hypothyroidism, unspecified: Secondary | ICD-10-CM | POA: Diagnosis not present

## 2020-10-05 DIAGNOSIS — L409 Psoriasis, unspecified: Secondary | ICD-10-CM

## 2020-10-05 DIAGNOSIS — Z0000 Encounter for general adult medical examination without abnormal findings: Secondary | ICD-10-CM

## 2020-10-05 LAB — LIPID PANEL
Cholesterol: 259 mg/dL — ABNORMAL HIGH (ref 0–200)
HDL: 35.5 mg/dL — ABNORMAL LOW (ref >39.00)
Total CHOL/HDL Ratio: 7
Triglycerides: 459 mg/dL — ABNORMAL HIGH (ref 0.0–149.0)

## 2020-10-05 LAB — HEPATIC FUNCTION PANEL
ALT: 60 U/L — ABNORMAL HIGH (ref 0–35)
AST: 46 U/L — ABNORMAL HIGH (ref 0–37)
Albumin: 4.4 g/dL (ref 3.5–5.2)
Alkaline Phosphatase: 81 U/L (ref 39–117)
Bilirubin, Direct: 0.1 mg/dL (ref 0.0–0.3)
Total Bilirubin: 0.5 mg/dL (ref 0.2–1.2)
Total Protein: 7.4 g/dL (ref 6.0–8.3)

## 2020-10-05 LAB — CBC WITH DIFFERENTIAL/PLATELET
Basophils Absolute: 0.1 10*3/uL (ref 0.0–0.1)
Basophils Relative: 0.9 % (ref 0.0–3.0)
Eosinophils Absolute: 0.6 10*3/uL (ref 0.0–0.7)
Eosinophils Relative: 9 % — ABNORMAL HIGH (ref 0.0–5.0)
HCT: 41.4 % (ref 36.0–46.0)
Hemoglobin: 14 g/dL (ref 12.0–15.0)
Lymphocytes Relative: 21.2 % (ref 12.0–46.0)
Lymphs Abs: 1.5 10*3/uL (ref 0.7–4.0)
MCHC: 33.9 g/dL (ref 30.0–36.0)
MCV: 87.6 fl (ref 78.0–100.0)
Monocytes Absolute: 0.6 10*3/uL (ref 0.1–1.0)
Monocytes Relative: 8.8 % (ref 3.0–12.0)
Neutro Abs: 4.3 10*3/uL (ref 1.4–7.7)
Neutrophils Relative %: 60.1 % (ref 43.0–77.0)
Platelets: 216 10*3/uL (ref 150.0–400.0)
RBC: 4.72 Mil/uL (ref 3.87–5.11)
RDW: 13.9 % (ref 11.5–15.5)
WBC: 7.2 10*3/uL (ref 4.0–10.5)

## 2020-10-05 LAB — T3, FREE: T3, Free: 3.5 pg/mL (ref 2.3–4.2)

## 2020-10-05 LAB — TSH: TSH: 0.15 u[IU]/mL — ABNORMAL LOW (ref 0.35–4.50)

## 2020-10-05 LAB — BASIC METABOLIC PANEL
BUN: 11 mg/dL (ref 6–23)
CO2: 30 mEq/L (ref 19–32)
Calcium: 9.8 mg/dL (ref 8.4–10.5)
Chloride: 102 mEq/L (ref 96–112)
Creatinine, Ser: 0.72 mg/dL (ref 0.40–1.20)
GFR: 95.74 mL/min (ref >60.00)
Glucose, Bld: 184 mg/dL — ABNORMAL HIGH (ref 70–99)
Potassium: 4.4 mEq/L (ref 3.5–5.1)
Sodium: 140 mEq/L (ref 135–145)

## 2020-10-05 LAB — LDL CHOLESTEROL, DIRECT: Direct LDL: 152 mg/dL

## 2020-10-05 LAB — HEMOGLOBIN A1C: Hgb A1c MFr Bld: 7.9 % — ABNORMAL HIGH (ref 4.6–6.5)

## 2020-10-05 LAB — T4, FREE: Free T4: 0.97 ng/dL (ref 0.60–1.60)

## 2020-10-05 MED ORDER — LEVOTHYROXINE SODIUM 175 MCG PO TABS: 175.0000 ug | ORAL_TABLET | Freq: Every day | ORAL | 3 refills | Status: DC

## 2020-10-05 MED ORDER — VENLAFAXINE HCL ER 150 MG PO CP24: 150.0000 mg | ORAL_CAPSULE | Freq: Every day | ORAL | 3 refills | Status: DC

## 2020-10-05 MED ORDER — VENLAFAXINE HCL ER 75 MG PO CP24: 75.0000 mg | ORAL_CAPSULE | Freq: Every day | ORAL | 3 refills | Status: DC

## 2020-10-05 MED ORDER — OMEPRAZOLE 40 MG PO CPDR
40.0000 mg | DELAYED_RELEASE_CAPSULE | Freq: Every day | ORAL | 3 refills | Status: DC
Start: 2020-10-05 — End: 2021-06-20

## 2020-10-05 MED ORDER — TRIAMCINOLONE ACETONIDE 0.1 % EX CREA
1.0000 | TOPICAL_CREAM | Freq: Two times a day (BID) | CUTANEOUS | 5 refills | Status: DC
Start: 2020-10-05 — End: 2022-08-12

## 2020-10-05 NOTE — Progress Notes (Signed)
Subjective:    Patient ID: Gabriela Conley, female    DOB: May 04, 1966, 54 y.o.   MRN: [id number]  HPI Here for a well exam. She has been seeing Dr. Tarri Glenn for some GI issues, and she had upper and lower endoscopies in September. She recently had a breath test and the results are pending. She has been having more anxiety than usual and she asks if the Effexor dose can be increased a little. Also she developed an itchy rash on both elbows about 2 months ago.    Review of Systems  Constitutional: Negative.   HENT: Negative.   Eyes: Negative.   Respiratory: Negative.   Cardiovascular: Negative.   Gastrointestinal: Positive for abdominal pain.  Genitourinary: Negative for decreased urine volume, difficulty urinating, dyspareunia, dysuria, enuresis, flank pain, frequency, hematuria, pelvic pain and urgency.  Musculoskeletal: Negative.   Skin: Positive for rash.  Neurological: Negative.   Psychiatric/Behavioral: The patient is nervous/anxious.        Objective:   Physical Exam Constitutional:      General: She is not in acute distress.    Appearance: She is well-developed. She is obese.  HENT:     Head: Normocephalic and atraumatic.     Right Ear: External ear normal.     Left Ear: External ear normal.     Nose: Nose normal.     Mouth/Throat:     Pharynx: No oropharyngeal exudate.  Eyes:     General: No scleral icterus.    Conjunctiva/sclera: Conjunctivae normal.     Pupils: Pupils are equal, round, and reactive to light.  Neck:     Thyroid: No thyromegaly.     Vascular: No JVD.  Cardiovascular:     Rate and Rhythm: Normal rate and regular rhythm.     Heart sounds: Normal heart sounds. No murmur heard.  No friction rub. No gallop.   Pulmonary:     Effort: Pulmonary effort is normal. No respiratory distress.     Breath sounds: Normal breath sounds. No wheezing or rales.  Chest:     Chest wall: No tenderness.  Abdominal:     General: Bowel sounds are normal. There is no  distension.     Palpations: Abdomen is soft. There is no mass.     Tenderness: There is no abdominal tenderness. There is no guarding or rebound.  Musculoskeletal:        General: No tenderness. Normal range of motion.     Cervical back: Normal range of motion and neck supple.  Lymphadenopathy:     Cervical: No cervical adenopathy.  Skin:    General: Skin is warm and dry.     Comments: There is a faint scaly rash on the extensor surfaces of both elbows   Neurological:     Mental Status: She is alert and oriented to person, place, and time.     Cranial Nerves: No cranial nerve deficit.     Motor: No abnormal muscle tone.     Coordination: Coordination normal.     Deep Tendon Reflexes: Reflexes are normal and symmetric. Reflexes normal.  Psychiatric:        Behavior: Behavior normal.        Thought Content: Thought content normal.        Judgment: Judgment normal.           Assessment & Plan:  Well exam. We discussed diet and exercise. Get fasting labs. For her anxiety, we will increase the Effexor XR dose to  a total of 225 mg daily. She has psoriasis, so she can use Triamcinolone cream BID as needed. I reminded her that she is past due for a mammogram.  Alysia Penna, MD

## 2020-10-11 ENCOUNTER — Telehealth: Payer: Self-pay

## 2020-10-11 NOTE — Telephone Encounter (Signed)
Called pt to f/u re: submission of SIBO specimen to [facility]. States she submitted her kit on 10/10/20. Will await results. Next f/u appt scheduled 10/26/20.

## 2020-10-12 ENCOUNTER — Telehealth: Payer: Self-pay

## 2020-10-12 NOTE — Telephone Encounter (Signed)
Received call from [place] with [facility]. States pt had submitted her SIBO test. However, order sent by our office did not match to kit received. More specifically, Vivien Rota states the kit received was lactulose rather than glucose. Wanted to verify correct test to be performed. Rolm Gala, according to 09/05/20 office notes, Dr. Tarri Glenn ordered SIBO - glucose to be completed. Also advised, according to pt call with [facility] on 09/18/20, [facility] sent the glucose kit for pt to complete. Advised it is possible that the pt was provided the lactulose kit by a staff member in our office erroneously. Vivien Rota again verified x3 the correct order to be processed for results. Again repeated x3, SIBO - glucose is to be completed per Dr. Tarri Glenn orders. Vivien Rota state she will call pt to ensure she still has the glucose kit they had sent and ask that she complete the glucose kit as ordered. Will continue to await results.

## 2020-10-13 ENCOUNTER — Telehealth: Payer: Self-pay | Admitting: Gastroenterology

## 2020-10-13 ENCOUNTER — Other Ambulatory Visit: Payer: Self-pay

## 2020-10-13 MED ORDER — METFORMIN HCL 500 MG PO TABS
500.0000 mg | ORAL_TABLET | Freq: Two times a day (BID) | ORAL | 3 refills | Status: DC
Start: 2020-10-13 — End: 2021-02-09

## 2020-10-13 NOTE — Telephone Encounter (Signed)
Returned pt call as requested. LVM requesting returned call.

## 2020-10-13 NOTE — Telephone Encounter (Signed)
Patient notified And rx sent to the pharmacy. Dm/cma

## 2020-10-16 NOTE — Telephone Encounter (Signed)
Called pt today as requested by the pt. States she received a call from [place] w/ [facility] advising pt to call our office to inquire about the test that needed to be completed. Advised that I did speak with Vivien Rota and that Vivien Rota IS aware of the correct order provided by Dr. Tarri Glenn. Asked if she still had the glucose kit sent by [facility]. Pt confirmed. Advised she complete the glucose test and submit as she is able. Apologized for the confusion but reassured [facility] IS aware of the correct order and the correct test they will need to process. Pt verbalized acceptance and understanding. States she will complete the test this weekend.

## 2020-10-26 ENCOUNTER — Ambulatory Visit: Payer: 59 | Admitting: Gastroenterology

## 2021-02-09 ENCOUNTER — Other Ambulatory Visit: Payer: Self-pay | Admitting: Family Medicine

## 2021-02-14 ENCOUNTER — Ambulatory Visit: Payer: 59 | Admitting: Family Medicine

## 2021-02-14 DIAGNOSIS — Z0289 Encounter for other administrative examinations: Secondary | ICD-10-CM

## 2021-03-03 ENCOUNTER — Other Ambulatory Visit: Payer: Self-pay | Admitting: Family Medicine

## 2021-04-04 ENCOUNTER — Other Ambulatory Visit: Payer: Self-pay | Admitting: Family Medicine

## 2021-05-21 ENCOUNTER — Other Ambulatory Visit: Payer: Self-pay | Admitting: Family Medicine

## 2021-06-20 ENCOUNTER — Other Ambulatory Visit: Payer: Self-pay | Admitting: Family Medicine

## 2021-09-14 ENCOUNTER — Other Ambulatory Visit: Payer: Self-pay | Admitting: Family Medicine

## 2021-10-08 ENCOUNTER — Other Ambulatory Visit: Payer: Self-pay | Admitting: Family Medicine

## 2021-10-08 NOTE — Telephone Encounter (Signed)
Tried to call pt for appointment pt mailbox is full, no option to leave a message

## 2021-10-09 NOTE — Telephone Encounter (Signed)
Pt needs appointment for further refills 

## 2021-10-15 ENCOUNTER — Other Ambulatory Visit: Payer: Self-pay | Admitting: Family Medicine

## 2021-11-06 ENCOUNTER — Other Ambulatory Visit: Payer: Self-pay | Admitting: Family Medicine

## 2021-11-06 NOTE — Telephone Encounter (Signed)
Pt has a CPE tomorrow, pt states that she had enough until he appointment on 11/07/2020 Refill not sent, due to possible Rx change

## 2021-11-07 ENCOUNTER — Ambulatory Visit (INDEPENDENT_AMBULATORY_CARE_PROVIDER_SITE_OTHER): Payer: Self-pay | Admitting: Family Medicine

## 2021-11-07 ENCOUNTER — Encounter: Payer: Self-pay | Admitting: Family Medicine

## 2021-11-07 VITALS — BP 118/78 | HR 74 | Temp 98.6°F | Ht 62.5 in | Wt 192.4 lb

## 2021-11-07 DIAGNOSIS — Z0000 Encounter for general adult medical examination without abnormal findings: Secondary | ICD-10-CM

## 2021-11-07 DIAGNOSIS — L2084 Intrinsic (allergic) eczema: Secondary | ICD-10-CM

## 2021-11-07 DIAGNOSIS — L309 Dermatitis, unspecified: Secondary | ICD-10-CM | POA: Insufficient documentation

## 2021-11-07 DIAGNOSIS — E039 Hypothyroidism, unspecified: Secondary | ICD-10-CM

## 2021-11-07 DIAGNOSIS — E785 Hyperlipidemia, unspecified: Secondary | ICD-10-CM | POA: Insufficient documentation

## 2021-11-07 DIAGNOSIS — E1169 Type 2 diabetes mellitus with other specified complication: Secondary | ICD-10-CM | POA: Insufficient documentation

## 2021-11-07 MED ORDER — BETAMETHASONE DIPROPIONATE 0.05 % EX CREA: TOPICAL_CREAM | Freq: Two times a day (BID) | CUTANEOUS | 5 refills | Status: DC

## 2021-11-07 MED ORDER — VENLAFAXINE HCL ER 150 MG PO CP24: 150.0000 mg | ORAL_CAPSULE | Freq: Every day | ORAL | 3 refills | Status: DC

## 2021-11-07 MED ORDER — LEVOTHYROXINE SODIUM 175 MCG PO TABS: 175.0000 ug | ORAL_TABLET | Freq: Every day | ORAL | 3 refills | Status: DC

## 2021-11-07 NOTE — Progress Notes (Signed)
° °  Subjective:    Patient ID: Gabriela Conley, female    DOB: August 01, 1966, 56 y.o.   MRN: [id number]  HPI Here for a well exam. She feels fine in general. She does say that the Triamcinolone cream does not help her eczema as well as it used to.    Review of Systems  Constitutional: Negative.   HENT: Negative.    Eyes: Negative.   Respiratory: Negative.    Cardiovascular: Negative.   Gastrointestinal: Negative.   Genitourinary:  Negative for decreased urine volume, difficulty urinating, dyspareunia, dysuria, enuresis, flank pain, frequency, hematuria, pelvic pain and urgency.  Musculoskeletal: Negative.   Skin:  Positive for rash.  Neurological: Negative.  Negative for headaches.  Psychiatric/Behavioral: Negative.        Objective:   Physical Exam Constitutional:      General: She is not in acute distress.    Appearance: Normal appearance. She is well-developed.  HENT:     Head: Normocephalic and atraumatic.     Right Ear: External ear normal.     Left Ear: External ear normal.     Nose: Nose normal.     Mouth/Throat:     Pharynx: No oropharyngeal exudate.  Eyes:     General: No scleral icterus.    Conjunctiva/sclera: Conjunctivae normal.     Pupils: Pupils are equal, round, and reactive to light.  Neck:     Thyroid: No thyromegaly.     Vascular: No JVD.  Cardiovascular:     Rate and Rhythm: Normal rate and regular rhythm.     Heart sounds: Normal heart sounds. No murmur heard.   No friction rub. No gallop.  Pulmonary:     Effort: Pulmonary effort is normal. No respiratory distress.     Breath sounds: Normal breath sounds. No wheezing or rales.  Chest:     Chest wall: No tenderness.  Abdominal:     General: Bowel sounds are normal. There is no distension.     Palpations: Abdomen is soft. There is no mass.     Tenderness: There is no abdominal tenderness. There is no guarding or rebound.  Musculoskeletal:        General: No tenderness. Normal range of motion.      Cervical back: Normal range of motion and neck supple.  Lymphadenopathy:     Cervical: No cervical adenopathy.  Skin:    General: Skin is warm and dry.     Findings: Rash present.     Comments: Scattered small red excoriated areas on the arms and trunk   Neurological:     Mental Status: She is alert and oriented to person, place, and time.     Cranial Nerves: No cranial nerve deficit.     Motor: No abnormal muscle tone.     Coordination: Coordination normal.     Deep Tendon Reflexes: Reflexes are normal and symmetric. Reflexes normal.  Psychiatric:        Behavior: Behavior normal.        Thought Content: Thought content normal.        Judgment: Judgment normal.          Assessment & Plan:  Well exam. We discussed diet and exercise. Get fasting labs soon. For the eczema, she will switch to Betamethasone cream.  Alysia Penna, MD

## 2021-11-08 ENCOUNTER — Other Ambulatory Visit (INDEPENDENT_AMBULATORY_CARE_PROVIDER_SITE_OTHER): Payer: Self-pay

## 2021-11-08 DIAGNOSIS — E039 Hypothyroidism, unspecified: Secondary | ICD-10-CM

## 2021-11-08 DIAGNOSIS — E785 Hyperlipidemia, unspecified: Secondary | ICD-10-CM

## 2021-11-08 DIAGNOSIS — E1169 Type 2 diabetes mellitus with other specified complication: Secondary | ICD-10-CM

## 2021-11-08 DIAGNOSIS — Z0000 Encounter for general adult medical examination without abnormal findings: Secondary | ICD-10-CM

## 2021-11-08 LAB — LDL CHOLESTEROL, DIRECT: Direct LDL: 114 mg/dL

## 2021-11-08 LAB — HEPATIC FUNCTION PANEL
ALT: 23 U/L (ref 0–35)
AST: 19 U/L (ref 0–37)
Albumin: 4.2 g/dL (ref 3.5–5.2)
Alkaline Phosphatase: 68 U/L (ref 39–117)
Bilirubin, Direct: 0.1 mg/dL (ref 0.0–0.3)
Total Bilirubin: 0.4 mg/dL (ref 0.2–1.2)
Total Protein: 7.2 g/dL (ref 6.0–8.3)

## 2021-11-08 LAB — BASIC METABOLIC PANEL
BUN: 11 mg/dL (ref 6–23)
CO2: 27 mEq/L (ref 19–32)
Calcium: 9.4 mg/dL (ref 8.4–10.5)
Chloride: 105 mEq/L (ref 96–112)
Creatinine, Ser: 0.79 mg/dL (ref 0.40–1.20)
GFR: 85 mL/min (ref >60.00)
Glucose, Bld: 138 mg/dL — ABNORMAL HIGH (ref 70–99)
Potassium: 3.8 mEq/L (ref 3.5–5.1)
Sodium: 140 mEq/L (ref 135–145)

## 2021-11-08 LAB — LIPID PANEL
Cholesterol: 229 mg/dL — ABNORMAL HIGH (ref 0–200)
HDL: 34.3 mg/dL — ABNORMAL LOW (ref >39.00)
Total CHOL/HDL Ratio: 7
Triglycerides: 520 mg/dL — ABNORMAL HIGH (ref 0.0–149.0)

## 2021-11-08 LAB — CBC WITH DIFFERENTIAL/PLATELET
Basophils Absolute: 0.1 10*3/uL (ref 0.0–0.1)
Basophils Relative: 0.9 % (ref 0.0–3.0)
Eosinophils Absolute: 0.7 10*3/uL (ref 0.0–0.7)
Eosinophils Relative: 9.8 % — ABNORMAL HIGH (ref 0.0–5.0)
HCT: 40.3 % (ref 36.0–46.0)
Hemoglobin: 13.6 g/dL (ref 12.0–15.0)
Lymphocytes Relative: 28.8 % (ref 12.0–46.0)
Lymphs Abs: 2 10*3/uL (ref 0.7–4.0)
MCHC: 33.7 g/dL (ref 30.0–36.0)
MCV: 89.5 fl (ref 78.0–100.0)
Monocytes Absolute: 0.7 10*3/uL (ref 0.1–1.0)
Monocytes Relative: 9.8 % (ref 3.0–12.0)
Neutro Abs: 3.5 10*3/uL (ref 1.4–7.7)
Neutrophils Relative %: 50.7 % (ref 43.0–77.0)
Platelets: 221 10*3/uL (ref 150.0–400.0)
RBC: 4.5 Mil/uL (ref 3.87–5.11)
RDW: 13.4 % (ref 11.5–15.5)
WBC: 7 10*3/uL (ref 4.0–10.5)

## 2021-11-08 LAB — T4, FREE: Free T4: 1.13 ng/dL (ref 0.60–1.60)

## 2021-11-08 LAB — HEMOGLOBIN A1C: Hgb A1c MFr Bld: 7.1 % — ABNORMAL HIGH (ref 4.6–6.5)

## 2021-11-08 LAB — TSH: TSH: 0.12 u[IU]/mL — ABNORMAL LOW (ref 0.35–5.50)

## 2021-11-08 LAB — T3, FREE: T3, Free: 3.5 pg/mL (ref 2.3–4.2)

## 2021-11-09 MED ORDER — METFORMIN HCL 1000 MG PO TABS: 1000.0000 mg | ORAL_TABLET | Freq: Two times a day (BID) | ORAL | 3 refills | Status: DC

## 2021-11-09 MED ORDER — ATORVASTATIN CALCIUM 10 MG PO TABS: 10.0000 mg | ORAL_TABLET | Freq: Every day | ORAL | 3 refills | Status: DC

## 2021-12-14 ENCOUNTER — Other Ambulatory Visit: Payer: Self-pay | Admitting: Family Medicine

## 2022-01-16 ENCOUNTER — Encounter: Payer: Self-pay | Admitting: Family Medicine

## 2022-01-16 ENCOUNTER — Other Ambulatory Visit: Payer: Self-pay

## 2022-01-16 ENCOUNTER — Ambulatory Visit (INDEPENDENT_AMBULATORY_CARE_PROVIDER_SITE_OTHER): Payer: Self-pay | Admitting: Family Medicine

## 2022-01-16 VITALS — HR 90 | Temp 98.5°F | Wt 181.0 lb

## 2022-01-16 DIAGNOSIS — A491 Streptococcal infection, unspecified site: Secondary | ICD-10-CM

## 2022-01-16 DIAGNOSIS — R509 Fever, unspecified: Secondary | ICD-10-CM

## 2022-01-16 LAB — POC COVID19 BINAXNOW: SARS Coronavirus 2 Ag: NEGATIVE

## 2022-01-16 LAB — POCT INFLUENZA A/B
Influenza A, POC: NEGATIVE
Influenza B, POC: NEGATIVE

## 2022-01-16 LAB — POCT RAPID STREP A (OFFICE): Rapid Strep A Screen: POSITIVE — AB

## 2022-01-16 MED ORDER — CEPHALEXIN 500 MG PO CAPS: 500.0000 mg | ORAL_CAPSULE | Freq: Three times a day (TID) | ORAL | 0 refills | Status: AC

## 2022-01-16 MED ORDER — ONDANSETRON HCL 8 MG PO TABS: 8.0000 mg | ORAL_TABLET | Freq: Four times a day (QID) | ORAL | 1 refills | Status: DC | PRN

## 2022-01-16 NOTE — Progress Notes (Signed)
? ?  Subjective:  ? ? Patient ID: Gabriela Conley, female    DOB: 10-29-1966, 56 y.o.   MRN: [id number] ? ?HPI ?Here for one week of stuffy head, PND, a dry cough, nausea with vomiting, and diarrhea. No fever or SOB. No body aches. No ST. She is drinking fluids. She has been around her granddaughter who has been sick recently. She tests positive for strep today and is negative for flu and Covid. ? ? ?Review of Systems  ?Constitutional:  Positive for chills. Negative for diaphoresis and fever.  ?HENT:  Positive for congestion and postnasal drip. Negative for ear pain and sore throat.   ?Eyes: Negative.   ?Respiratory:  Positive for cough. Negative for shortness of breath and wheezing.   ?Cardiovascular: Negative.   ?Gastrointestinal:  Positive for nausea and vomiting. Negative for abdominal distention, abdominal pain, blood in stool, constipation and diarrhea.  ? ?   ?Objective:  ? Physical Exam ?Constitutional:   ?   Comments: She appears ill   ?HENT:  ?   Right Ear: Tympanic membrane, ear canal and external ear normal.  ?   Left Ear: Tympanic membrane, ear canal and external ear normal.  ?   Nose: Nose normal.  ?   Mouth/Throat:  ?   Pharynx: Oropharynx is clear.  ?Eyes:  ?   Conjunctiva/sclera: Conjunctivae normal.  ?Cardiovascular:  ?   Rate and Rhythm: Normal rate and regular rhythm.  ?   Pulses: Normal pulses.  ?   Heart sounds: Normal heart sounds.  ?Pulmonary:  ?   Effort: Pulmonary effort is normal.  ?   Breath sounds: Normal breath sounds.  ?Abdominal:  ?   General: Abdomen is flat. Bowel sounds are normal. There is no distension.  ?   Palpations: Abdomen is soft. There is no mass.  ?   Tenderness: There is no abdominal tenderness. There is no guarding or rebound.  ?   Hernia: No hernia is present.  ?Lymphadenopathy:  ?   Cervical: No cervical adenopathy.  ?Neurological:  ?   Mental Status: She is alert.  ? ? ? ? ? ?   ?Assessment & Plan:  ?Strep infection. Treat with 10 days of keflex. Use Zofran as needed for  nausea. Recheck prn. ?Alysia Penna, MD ? ? ?

## 2022-01-21 ENCOUNTER — Telehealth: Payer: Self-pay

## 2022-01-21 NOTE — Telephone Encounter (Signed)
--  Caller states she has strep. States she saw PCP ?on Wednesday and started an antibiotic. She has ?nausea and diarrhea. Nausea started on Monday. All ?other symptoms started 2 weeks ago. States taking ?cephalexin '500mg'$ . Denies any fever. ? ? ?01/19/2022 10:10:05 AM See HCP within 4 Hours (or PCP triage) ?[place], Therapist, sports, Museum/gallery conservator ? ?Referrals ?GO TO FACILITY UNDECIDED ? ?01/21/22 1307: Pt had OV for strep on 01/16/22. Pt states she was having cold sweats & nausea. States last vomited last PM. Pt encouraged to take abx with food. Pt denies using Zofran b/c of price (self pay). Pt given OTC antiemetics that may be more cost effective. Pt advised that if symptoms persists or worsen to scheduled f/u with PCP on 3/27. Pt verb understanding. ? ? ?

## 2022-02-08 ENCOUNTER — Other Ambulatory Visit: Payer: Self-pay

## 2022-02-11 ENCOUNTER — Other Ambulatory Visit (INDEPENDENT_AMBULATORY_CARE_PROVIDER_SITE_OTHER): Payer: Self-pay

## 2022-02-11 DIAGNOSIS — E785 Hyperlipidemia, unspecified: Secondary | ICD-10-CM

## 2022-02-11 DIAGNOSIS — E1169 Type 2 diabetes mellitus with other specified complication: Secondary | ICD-10-CM

## 2022-02-11 LAB — LIPID PANEL
Cholesterol: 160 mg/dL (ref 0–200)
HDL: 37.9 mg/dL — ABNORMAL LOW (ref >39.00)
NonHDL: 122.41
Total CHOL/HDL Ratio: 4
Triglycerides: 235 mg/dL — ABNORMAL HIGH (ref 0.0–149.0)
VLDL: 47 mg/dL — ABNORMAL HIGH (ref 0.0–40.0)

## 2022-02-11 LAB — HEPATIC FUNCTION PANEL
ALT: 21 U/L (ref 0–35)
AST: 18 U/L (ref 0–37)
Albumin: 4.3 g/dL (ref 3.5–5.2)
Alkaline Phosphatase: 98 U/L (ref 39–117)
Bilirubin, Direct: 0 mg/dL (ref 0.0–0.3)
Total Bilirubin: 0.3 mg/dL (ref 0.2–1.2)
Total Protein: 7.1 g/dL (ref 6.0–8.3)

## 2022-02-11 LAB — LDL CHOLESTEROL, DIRECT: Direct LDL: 94 mg/dL

## 2022-02-11 LAB — HEMOGLOBIN A1C: Hgb A1c MFr Bld: 6.9 % — ABNORMAL HIGH (ref 4.6–6.5)

## 2022-04-12 ENCOUNTER — Other Ambulatory Visit: Payer: Self-pay | Admitting: Family Medicine

## 2022-04-18 ENCOUNTER — Telehealth: Payer: Self-pay

## 2022-04-18 NOTE — Telephone Encounter (Signed)
Pt confirms that she is due a mammogram, but states she currently does not have insurance. Scheduled for mobile mammogram on 06/14/22 & will investigate options for pt. Will return call to patient if additional information is needed.

## 2022-04-23 NOTE — Telephone Encounter (Signed)
Attempted to call pt to have her complete paperwork for [employer]. Will leave paperwork at front desk for pt to complete & return. Just need signature & income per year, pt does not need to turn anything in. Can complete & return in same visit. Paperwork at front desk in [facility].

## 2022-06-22 ENCOUNTER — Other Ambulatory Visit: Payer: Self-pay | Admitting: Family Medicine

## 2022-08-12 ENCOUNTER — Encounter: Payer: Self-pay | Admitting: Family Medicine

## 2022-08-12 ENCOUNTER — Ambulatory Visit (INDEPENDENT_AMBULATORY_CARE_PROVIDER_SITE_OTHER): Payer: Self-pay | Admitting: Family Medicine

## 2022-08-12 VITALS — BP 130/80 | HR 85 | Temp 98.4°F | Wt 179.0 lb

## 2022-08-12 DIAGNOSIS — J020 Streptococcal pharyngitis: Secondary | ICD-10-CM

## 2022-08-12 DIAGNOSIS — R059 Cough, unspecified: Secondary | ICD-10-CM

## 2022-08-12 LAB — POC COVID19 BINAXNOW: SARS Coronavirus 2 Ag: NEGATIVE

## 2022-08-12 LAB — POCT RAPID STREP A (OFFICE): Rapid Strep A Screen: POSITIVE — AB

## 2022-08-12 MED ORDER — CEFUROXIME AXETIL 500 MG PO TABS: 500.0000 mg | ORAL_TABLET | Freq: Two times a day (BID) | ORAL | 0 refills | Status: AC

## 2022-08-12 NOTE — Progress Notes (Signed)
   Subjective:    Patient ID: Gabriela Conley, female    DOB: May 19, 1966, 56 y.o.   MRN: [id number]  HPI Here for 2 weeks of chills, sweats. ST, dry cough, and NVD. She drinking fluids and taking Theraflu with no improvement.    Review of Systems  Constitutional:  Positive for chills and diaphoresis.  HENT:  Positive for congestion and sore throat. Negative for ear pain, postnasal drip and sinus pressure.   Eyes: Negative.   Respiratory:  Positive for cough. Negative for shortness of breath and wheezing.   Gastrointestinal:  Positive for diarrhea, nausea and vomiting. Negative for abdominal pain and blood in stool.       Objective:   Physical Exam Constitutional:      Appearance: Normal appearance. She is not ill-appearing.  HENT:     Right Ear: Tympanic membrane, ear canal and external ear normal.     Left Ear: Tympanic membrane, ear canal and external ear normal.     Nose: Nose normal.     Mouth/Throat:     Pharynx: Posterior oropharyngeal erythema present. No oropharyngeal exudate.  Eyes:     Conjunctiva/sclera: Conjunctivae normal.  Pulmonary:     Effort: Pulmonary effort is normal.     Breath sounds: Normal breath sounds.  Lymphadenopathy:     Cervical: No cervical adenopathy.  Neurological:     Mental Status: She is alert.           Assessment & Plan:  Strep pharyngitis, treat with 10 days of Cefuroxime.  Alysia Penna, MD

## 2022-11-17 ENCOUNTER — Other Ambulatory Visit: Payer: Self-pay | Admitting: Family Medicine

## 2022-11-21 ENCOUNTER — Ambulatory Visit (INDEPENDENT_AMBULATORY_CARE_PROVIDER_SITE_OTHER): Payer: Self-pay | Admitting: Family Medicine

## 2022-11-21 ENCOUNTER — Encounter: Payer: Self-pay | Admitting: Family Medicine

## 2022-11-21 VITALS — BP 124/84 | HR 81 | Temp 98.4°F | Ht 62.5 in | Wt 193.0 lb

## 2022-11-21 DIAGNOSIS — E538 Deficiency of other specified B group vitamins: Secondary | ICD-10-CM

## 2022-11-21 DIAGNOSIS — Z23 Encounter for immunization: Secondary | ICD-10-CM

## 2022-11-21 DIAGNOSIS — Z0000 Encounter for general adult medical examination without abnormal findings: Secondary | ICD-10-CM

## 2022-11-21 MED ORDER — BETAMETHASONE DIPROPIONATE AUG 0.05 % EX CREA: TOPICAL_CREAM | Freq: Two times a day (BID) | CUTANEOUS | 5 refills | Status: DC

## 2022-11-21 NOTE — Progress Notes (Signed)
   Subjective:    Patient ID: Gabriela Conley, female    DOB: 1966/06/15, 57 y.o.   MRN: [id number]  HPI Here for a well exam. Her only concern is her eczema. She has this over arms, legs, and trunk. She has been applying Triamcinolone cream, but this no longer works as well as it used to.    Review of Systems  Constitutional: Negative.   HENT: Negative.    Eyes: Negative.   Respiratory: Negative.    Cardiovascular: Negative.   Gastrointestinal: Negative.   Genitourinary:  Negative for decreased urine volume, difficulty urinating, dyspareunia, dysuria, enuresis, flank pain, frequency, hematuria, pelvic pain and urgency.  Musculoskeletal: Negative.   Skin:  Positive for rash.  Neurological: Negative.  Negative for headaches.  Psychiatric/Behavioral: Negative.         Objective:   Physical Exam Constitutional:      General: She is not in acute distress.    Appearance: Normal appearance. She is well-developed.  HENT:     Head: Normocephalic and atraumatic.     Right Ear: External ear normal.     Left Ear: External ear normal.     Nose: Nose normal.     Mouth/Throat:     Pharynx: No oropharyngeal exudate.  Eyes:     General: No scleral icterus.    Conjunctiva/sclera: Conjunctivae normal.     Pupils: Pupils are equal, round, and reactive to light.  Neck:     Thyroid: No thyromegaly.     Vascular: No JVD.  Cardiovascular:     Rate and Rhythm: Normal rate and regular rhythm.     Heart sounds: Normal heart sounds. No murmur heard.    No friction rub. No gallop.  Pulmonary:     Effort: Pulmonary effort is normal. No respiratory distress.     Breath sounds: Normal breath sounds. No wheezing or rales.  Chest:     Chest wall: No tenderness.  Abdominal:     General: Bowel sounds are normal. There is no distension.     Palpations: Abdomen is soft. There is no mass.     Tenderness: There is no abdominal tenderness. There is no guarding or rebound.  Musculoskeletal:        General:  No tenderness. Normal range of motion.     Cervical back: Normal range of motion and neck supple.  Lymphadenopathy:     Cervical: No cervical adenopathy.  Skin:    General: Skin is warm and dry.     Comments: Scattered patches of macular erythema with scaling   Neurological:     Mental Status: She is alert and oriented to person, place, and time.     Cranial Nerves: No cranial nerve deficit.     Motor: No abnormal muscle tone.     Coordination: Coordination normal.     Deep Tendon Reflexes: Reflexes are normal and symmetric. Reflexes normal.  Psychiatric:        Behavior: Behavior normal.        Thought Content: Thought content normal.        Judgment: Judgment normal.           Assessment & Plan:  Well exam. We discussed diet and exercise. Get fasting labs. For the eczema, she will try Betamethasone cream.  Alysia Penna, MD

## 2022-11-25 ENCOUNTER — Other Ambulatory Visit (INDEPENDENT_AMBULATORY_CARE_PROVIDER_SITE_OTHER): Payer: Self-pay

## 2022-11-25 DIAGNOSIS — Z0000 Encounter for general adult medical examination without abnormal findings: Secondary | ICD-10-CM

## 2022-11-25 DIAGNOSIS — E538 Deficiency of other specified B group vitamins: Secondary | ICD-10-CM

## 2022-11-25 LAB — HEPATIC FUNCTION PANEL
ALT: 25 U/L (ref 0–35)
AST: 22 U/L (ref 0–37)
Albumin: 4.4 g/dL (ref 3.5–5.2)
Alkaline Phosphatase: 91 U/L (ref 39–117)
Bilirubin, Direct: 0.1 mg/dL (ref 0.0–0.3)
Total Bilirubin: 0.4 mg/dL (ref 0.2–1.2)
Total Protein: 7.5 g/dL (ref 6.0–8.3)

## 2022-11-25 LAB — CBC WITH DIFFERENTIAL/PLATELET
Basophils Absolute: 0 10*3/uL (ref 0.0–0.1)
Basophils Relative: 0.6 % (ref 0.0–3.0)
Eosinophils Absolute: 0.6 10*3/uL (ref 0.0–0.7)
Eosinophils Relative: 7.4 % — ABNORMAL HIGH (ref 0.0–5.0)
HCT: 38.3 % (ref 36.0–46.0)
Hemoglobin: 13 g/dL (ref 12.0–15.0)
Lymphocytes Relative: 24.2 % (ref 12.0–46.0)
Lymphs Abs: 1.9 10*3/uL (ref 0.7–4.0)
MCHC: 34 g/dL (ref 30.0–36.0)
MCV: 88.6 fl (ref 78.0–100.0)
Monocytes Absolute: 0.8 10*3/uL (ref 0.1–1.0)
Monocytes Relative: 10.1 % (ref 3.0–12.0)
Neutro Abs: 4.4 10*3/uL (ref 1.4–7.7)
Neutrophils Relative %: 57.7 % (ref 43.0–77.0)
Platelets: 233 10*3/uL (ref 150.0–400.0)
RBC: 4.33 Mil/uL (ref 3.87–5.11)
RDW: 12.8 % (ref 11.5–15.5)
WBC: 7.6 10*3/uL (ref 4.0–10.5)

## 2022-11-25 LAB — LIPID PANEL
Cholesterol: 177 mg/dL (ref 0–200)
HDL: 39 mg/dL — ABNORMAL LOW (ref >39.00)
NonHDL: 137.57
Total CHOL/HDL Ratio: 5
Triglycerides: 310 mg/dL — ABNORMAL HIGH (ref 0.0–149.0)
VLDL: 62 mg/dL — ABNORMAL HIGH (ref 0.0–40.0)

## 2022-11-25 LAB — HEMOGLOBIN A1C: Hgb A1c MFr Bld: 8.4 % — ABNORMAL HIGH (ref 4.6–6.5)

## 2022-11-25 LAB — LDL CHOLESTEROL, DIRECT: Direct LDL: 97 mg/dL

## 2022-11-25 LAB — BASIC METABOLIC PANEL
BUN: 13 mg/dL (ref 6–23)
CO2: 29 mEq/L (ref 19–32)
Calcium: 9.7 mg/dL (ref 8.4–10.5)
Chloride: 101 mEq/L (ref 96–112)
Creatinine, Ser: 0.67 mg/dL (ref 0.40–1.20)
GFR: 98.59 mL/min (ref >60.00)
Glucose, Bld: 191 mg/dL — ABNORMAL HIGH (ref 70–99)
Potassium: 3.9 mEq/L (ref 3.5–5.1)
Sodium: 140 mEq/L (ref 135–145)

## 2022-11-25 LAB — TSH: TSH: 0.53 u[IU]/mL (ref 0.35–5.50)

## 2022-11-25 LAB — VITAMIN B12: Vitamin B-12: >1500 pg/mL — ABNORMAL HIGH (ref 211–911)

## 2022-11-28 ENCOUNTER — Telehealth: Payer: Self-pay | Admitting: Family Medicine

## 2022-11-28 ENCOUNTER — Other Ambulatory Visit: Payer: Self-pay

## 2022-11-28 MED ORDER — GLIPIZIDE 10 MG PO TABS: 10.0000 mg | ORAL_TABLET | Freq: Two times a day (BID) | ORAL | 3 refills | Status: DC

## 2022-11-28 NOTE — Telephone Encounter (Signed)
Spoke with patient about results.

## 2022-11-28 NOTE — Telephone Encounter (Signed)
Returning call for results  °

## 2023-01-02 ENCOUNTER — Other Ambulatory Visit: Payer: Self-pay | Admitting: Family Medicine

## 2023-02-09 ENCOUNTER — Encounter: Payer: Self-pay | Admitting: *Deleted

## 2023-02-09 ENCOUNTER — Ambulatory Visit
Admission: EM | Admit: 2023-02-09 | Discharge: 2023-02-09 | Disposition: A | Discharge status: Home / Self Care | Payer: Self-pay | Attending: Nurse Practitioner | Admitting: Nurse Practitioner

## 2023-02-09 ENCOUNTER — Other Ambulatory Visit: Payer: Self-pay

## 2023-02-09 DIAGNOSIS — H6992 Unspecified Eustachian tube disorder, left ear: Secondary | ICD-10-CM

## 2023-02-09 DIAGNOSIS — J0100 Acute maxillary sinusitis, unspecified: Secondary | ICD-10-CM

## 2023-02-09 DIAGNOSIS — J45909 Unspecified asthma, uncomplicated: Secondary | ICD-10-CM

## 2023-02-09 MED ORDER — ALBUTEROL SULFATE HFA 108 (90 BASE) MCG/ACT IN AERS: 1.0000 | INHALATION_SPRAY | Freq: Four times a day (QID) | RESPIRATORY_TRACT | 0 refills | Status: DC | PRN

## 2023-02-09 MED ORDER — AMOXICILLIN-POT CLAVULANATE 875-125 MG PO TABS: 1.0000 | ORAL_TABLET | Freq: Two times a day (BID) | ORAL | 0 refills | Status: AC

## 2023-02-09 MED ORDER — FLUTICASONE PROPIONATE 50 MCG/ACT NA SUSP: 1.0000 | Freq: Every day | NASAL | 0 refills | Status: DC

## 2023-02-09 NOTE — ED Provider Notes (Addendum)
[facility]    CSN: [id number] Arrival date & time: 02/09/23  1106      History   Chief Complaint Chief Complaint  Patient presents with   Facial Pain   Nasal Congestion    HPI Gabriela Conley is a 57 y.o. female  presents for evaluation of URI symptoms for 10 days. Patient reports associated symptoms of sinus pressure/pain with purulent nasal discharge, postnasal drip, ear pain, aching teeth. Denies N/V/D, fevers, sore throat, body aches, shortness of breath. Patient does have a hx of asthma.  Does not currently have an inhaler.  Denies any asthma exacerbation symptoms with current illness.  Reports granddaughter is also ill.  Pt has taken Zyrtec, Mucinex, Tylenol OTC for symptoms. Pt has no other concerns at this time.   HPI  Past Medical History:  Diagnosis Date   Allergy    Anxiety    Asthma    Diabetes mellitus without complication    gestational    GERD (gastroesophageal reflux disease)    Hypothyroid     Patient Active Problem List   Diagnosis Date Noted   Dyslipidemia 11/07/2021   Type 2 diabetes mellitus with other specified complication 11/07/2021   Eczema 11/07/2021   Psoriasis 10/05/2020   Anemia 01/02/2011   Hypothyroidism 01/02/2011   Anxiety 01/02/2011    Past Surgical History:  Procedure Laterality Date   BREAST SURGERY     removal of benign cyst    COLONOSCOPY  07/20/2020   per Dr. Orvan FalconerBeavers, benign polyps, repeat in 7 yrs    CYSTECTOMY     removed from chest   ESOPHAGOGASTRODUODENOSCOPY  07/20/2020   per Dr. Orvan FalconerBeavers, large hiatal hernia and gastritis    HERNIA REPAIR     umbilical    TUBAL LIGATION      OB History     Gravida  4   Para  3   Term  2   Preterm  0   AB  1   Living  2      SAB  1   IAB  0   Ectopic  0   Multiple  0   Live Births               Home Medications    Prior to Admission medications   Medication Sig Start Date End Date Taking? Authorizing Provider  albuterol (VENTOLIN HFA) 108  (90 Base) MCG/ACT inhaler Inhale 1-2 puffs into the lungs every 6 (six) hours as needed for wheezing or shortness of breath. 02/09/23  Yes Radford PaxMayer, Jodi R, NP  amoxicillin-clavulanate (AUGMENTIN) 875-125 MG tablet Take 1 tablet by mouth every 12 (twelve) hours for 10 days. 02/09/23 02/19/23 Yes Radford PaxMayer, Jodi R, NP  augmented betamethasone dipropionate (DIPROLENE-AF) 0.05 % cream Apply topically 2 (two) times daily. 11/21/22  Yes Nelwyn SalisburyFry, Stephen A, MD  fluticasone (FLONASE) 50 MCG/ACT nasal spray Place 1 spray into both nostrils daily. 02/09/23  Yes Radford PaxMayer, Jodi R, NP  glipiZIDE (GLUCOTROL) 10 MG tablet Take 1 tablet (10 mg total) by mouth 2 (two) times daily before a meal. 11/28/22  Yes Nelwyn SalisburyFry, Stephen A, MD  levothyroxine (SYNTHROID) 175 MCG tablet TAKE 1 TABLET BY MOUTH DAILY BEFORE BREAKFAST. 11/18/22  Yes Nelwyn SalisburyFry, Stephen A, MD  metFORMIN (GLUCOPHAGE) 1000 MG tablet TAKE 1 TABLET (1,000 MG TOTAL) BY MOUTH TWICE A DAY WITH FOOD 11/18/22  Yes Nelwyn SalisburyFry, Stephen A, MD  omeprazole (PRILOSEC) 40 MG capsule TAKE 1 CAPSULE BY MOUTH EVERY DAY 11/18/22  Yes Gershon CraneFry, Stephen  A, MD  venlafaxine XR (EFFEXOR-XR) 150 MG 24 hr capsule TAKE 1 CAPSULE (150 MG TOTAL) BY MOUTH DAILY WITH BREAKFAST. *APPOINTMENT REQUIRED FOR GREATER QUALITY OF REFILLS 11/18/22  Yes Nelwyn Salisbury, MD  atorvastatin (LIPITOR) 10 MG tablet TAKE 1 TABLET BY MOUTH EVERY DAY 11/18/22   Nelwyn Salisbury, MD    Family History Family History  Problem Relation Age of Onset   Uterine cancer Mother    Emphysema Mother    Pancreatic cancer Father    Heart disease Brother    Diabetes Maternal Uncle    Colon cancer Neg Hx    Esophageal cancer Neg Hx     Social History Social History   Tobacco Use   Smoking status: Former    Types: Cigarettes   Smokeless tobacco: Never  Vaping Use   Vaping Use: Never used  Substance Use Topics   Alcohol use: Yes    Comment: rarely   Drug use: Yes    Types: Marijuana    Comment: occasionally     Allergies   Chocolate   Review  of Systems Review of Systems  HENT:  Positive for congestion, ear pain, postnasal drip, sinus pressure and sinus pain.      Physical Exam Triage Vital Signs ED Triage Vitals  Enc Vitals Group     BP 02/09/23 1144 137/83     Pulse Rate 02/09/23 1144 94     Resp 02/09/23 1144 18     Temp 02/09/23 1144 98.7 F (37.1 C)     Temp Source 02/09/23 1144 Oral     SpO2 02/09/23 1144 94 %     Weight --      Height --      Head Circumference --      Peak Flow --      Pain Score 02/09/23 1146 7     Pain Loc --      Pain Edu? --      Excl. in GC? --    No data found.  Updated Vital Signs BP 137/83   Pulse 94   Temp 98.7 F (37.1 C) (Oral)   Resp 18   LMP 12/04/2020 (Approximate)   SpO2 94%   Visual Acuity Right Eye Distance:   Left Eye Distance:   Bilateral Distance:    Right Eye Near:   Left Eye Near:    Bilateral Near:     Physical Exam Vitals and nursing note reviewed.  Constitutional:      General: Gabriela Conley is not in acute distress.    Appearance: Gabriela Conley is well-developed. Gabriela Conley is not ill-appearing.  HENT:     Head: Normocephalic and atraumatic.     Right Ear: Tympanic membrane and ear canal normal. Tympanic membrane is not erythematous.     Left Ear: Ear canal normal. A middle ear effusion is present. Tympanic membrane is not erythematous.     Nose: Congestion present.     Right Turbinates: Swollen and pale.     Left Turbinates: Swollen and pale.     Right Sinus: Maxillary sinus tenderness present.     Left Sinus: Maxillary sinus tenderness present.     Mouth/Throat:     Mouth: Mucous membranes are moist.     Pharynx: Oropharynx is clear. Uvula midline. No oropharyngeal exudate or posterior oropharyngeal erythema.     Tonsils: No tonsillar exudate or tonsillar abscesses.  Eyes:     Conjunctiva/sclera: Conjunctivae normal.     Pupils: Pupils are equal, round, and  reactive to light.  Cardiovascular:     Rate and Rhythm: Normal rate and regular rhythm.     Heart  sounds: Normal heart sounds.  Pulmonary:     Effort: Pulmonary effort is normal.     Breath sounds: Normal breath sounds.  Musculoskeletal:     Cervical back: Normal range of motion and neck supple.  Lymphadenopathy:     Cervical: No cervical adenopathy.  Skin:    General: Skin is warm and dry.  Neurological:     General: No focal deficit present.     Mental Status: Gabriela Conley is alert and oriented to person, place, and time.  Psychiatric:        Mood and Affect: Mood normal.        Behavior: Behavior normal.      UC Treatments / Results  Labs (all labs ordered are listed, but only abnormal results are displayed) Labs Reviewed - No data to display  EKG   Radiology No results found.  Procedures Procedures (including critical care time)  Medications Ordered in UC Medications - No data to display  Initial Impression / Assessment and Plan / UC Course  I have reviewed the triage vital signs and the nursing notes.  Pertinent labs & imaging results that were available during my care of the patient were reviewed by me and considered in my medical decision making (see chart for details).     Discussed exam and symptoms with patient.  No red flags Start Augmentin given length of symptoms Flonase daily Nasal rinses as tolerated Refilled albuterol inhaler to have available if needed Follow-up with PCP if symptoms do not improve ER precautions reviewed and patient verbalized understanding Final Clinical Impressions(s) / UC Diagnoses   Final diagnoses:  Acute maxillary sinusitis, recurrence not specified  Eustachian tube dysfunction, left  Mild asthma without complication, unspecified whether persistent     Discharge Instructions      Augmentin twice daily for 10 days Flonase daily Nasal rinses as tolerated I refilled your albuterol inhaler as you have it available if needed Please follow-up with your PCP if your symptoms or not improving Please go to emergency room for  any worsening symptoms   ED Prescriptions     Medication Sig Dispense Auth. Provider   amoxicillin-clavulanate (AUGMENTIN) 875-125 MG tablet Take 1 tablet by mouth every 12 (twelve) hours for 10 days. 20 tablet Radford Pax, NP   fluticasone (FLONASE) 50 MCG/ACT nasal spray Place 1 spray into both nostrils daily. 15.8 mL Radford Pax, NP   albuterol (VENTOLIN HFA) 108 (90 Base) MCG/ACT inhaler Inhale 1-2 puffs into the lungs every 6 (six) hours as needed for wheezing or shortness of breath. 1 each Radford Pax, NP      PDMP not reviewed this encounter.   Radford Pax, NP 02/09/23 1207    Radford Pax, NP 02/09/23 [phone]

## 2023-02-09 NOTE — ED Triage Notes (Addendum)
C/O sinus pressure and congestion along with facial pain onset approx 1.5 wks ago. Has tried Zyrtec, Mucines,Tylenol without relief. Unsure if fevers.

## 2023-02-09 NOTE — Discharge Instructions (Signed)
Augmentin twice daily for 10 days Flonase daily Nasal rinses as tolerated I refilled your albuterol inhaler as you have it available if needed Please follow-up with your PCP if your symptoms or not improving Please go to emergency room for any worsening symptoms

## 2023-02-13 ENCOUNTER — Other Ambulatory Visit: Payer: Self-pay | Admitting: Family Medicine

## 2023-03-03 ENCOUNTER — Other Ambulatory Visit (INDEPENDENT_AMBULATORY_CARE_PROVIDER_SITE_OTHER): Payer: Self-pay

## 2023-03-03 DIAGNOSIS — E1169 Type 2 diabetes mellitus with other specified complication: Secondary | ICD-10-CM

## 2023-03-03 LAB — HEMOGLOBIN A1C: Hgb A1c MFr Bld: 7.8 % — ABNORMAL HIGH (ref 4.6–6.5)

## 2023-03-07 MED ORDER — SITAGLIPTIN PHOSPHATE 100 MG PO TABS: 100.0000 mg | ORAL_TABLET | Freq: Every day | ORAL | 11 refills | Status: DC

## 2023-03-07 NOTE — Progress Notes (Signed)
jan

## 2023-03-07 NOTE — Addendum Note (Signed)
Addended by: Johnella Moloney on: 03/07/2023 10:08 AM   Modules accepted: Orders

## 2023-03-24 ENCOUNTER — Encounter: Payer: Self-pay | Admitting: Family Medicine

## 2023-03-25 NOTE — Telephone Encounter (Signed)
Which medication is she referring to? 

## 2023-03-25 NOTE — Telephone Encounter (Signed)
Cancel the Sitagliptin and instead call in Farxiga 5 mg daily, #30 with 2 rf

## 2023-03-26 MED ORDER — DAPAGLIFLOZIN PROPANEDIOL 5 MG PO TABS: 5.0000 mg | ORAL_TABLET | Freq: Every day | ORAL | 2 refills | Status: DC

## 2023-05-01 ENCOUNTER — Other Ambulatory Visit: Payer: Self-pay | Admitting: Family Medicine

## 2023-05-16 ENCOUNTER — Other Ambulatory Visit: Payer: Self-pay | Admitting: Family Medicine

## 2023-05-16 MED ORDER — OMEPRAZOLE 40 MG PO CPDR: 40.0000 mg | DELAYED_RELEASE_CAPSULE | Freq: Every day | ORAL | 0 refills | Status: DC

## 2023-05-16 MED ORDER — VENLAFAXINE HCL ER 150 MG PO CP24: 150.0000 mg | ORAL_CAPSULE | Freq: Every day | ORAL | 0 refills | Status: DC

## 2023-07-16 ENCOUNTER — Other Ambulatory Visit: Payer: Self-pay | Admitting: Family Medicine

## 2023-07-16 MED ORDER — METFORMIN HCL 1000 MG PO TABS: 1000.0000 mg | ORAL_TABLET | Freq: Two times a day (BID) | ORAL | 0 refills | Status: DC

## 2023-07-19 ENCOUNTER — Other Ambulatory Visit: Payer: Self-pay | Admitting: Family Medicine

## 2023-07-31 ENCOUNTER — Other Ambulatory Visit: Payer: Self-pay | Admitting: Family Medicine

## 2023-08-09 ENCOUNTER — Other Ambulatory Visit: Payer: Self-pay | Admitting: Family Medicine

## 2023-08-10 ENCOUNTER — Other Ambulatory Visit: Payer: Self-pay | Admitting: Family Medicine

## 2023-08-18 ENCOUNTER — Encounter: Payer: Self-pay | Admitting: Family Medicine

## 2023-08-20 ENCOUNTER — Encounter: Payer: Self-pay | Admitting: Family Medicine

## 2023-08-20 ENCOUNTER — Ambulatory Visit (INDEPENDENT_AMBULATORY_CARE_PROVIDER_SITE_OTHER): Payer: Self-pay | Admitting: Family Medicine

## 2023-08-20 VITALS — BP 124/78 | HR 87 | Temp 98.3°F | Wt 196.0 lb

## 2023-08-20 DIAGNOSIS — Z7984 Long term (current) use of oral hypoglycemic drugs: Secondary | ICD-10-CM

## 2023-08-20 DIAGNOSIS — E1169 Type 2 diabetes mellitus with other specified complication: Secondary | ICD-10-CM

## 2023-08-20 LAB — POCT GLYCOSYLATED HEMOGLOBIN (HGB A1C): Hemoglobin A1C: 7.9 % — AB (ref 4.0–5.6)

## 2023-08-20 MED ORDER — DAPAGLIFLOZIN PROPANEDIOL 5 MG PO TABS: 5.0000 mg | ORAL_TABLET | Freq: Two times a day (BID) | ORAL | 5 refills | Status: DC

## 2023-08-20 NOTE — Addendum Note (Signed)
Addended by: Carola Rhine on: 08/20/2023 05:08 PM   Modules accepted: Orders

## 2023-08-20 NOTE — Progress Notes (Signed)
   Subjective:    Patient ID: Gabriela Conley, female    DOB: [birth year], 57 y.o.   MRN: [id number]  HPI Here to follow up on diabetes. Her last A1c here on 03-03-23 was 7.8%, and that was when we added Farxiga 5 mg each morning to her Metformin and Glipizide. Since then her morning fasting glucoses have remained high (averaging 210-248), but her evening random glucoses are much better controlled (around [phone]. She feels fine. Her A1c today is 7.9%.   Review of Systems  Constitutional: Negative.   Respiratory: Negative.    Cardiovascular: Negative.   Neurological: Negative.        Objective:   Physical Exam Constitutional:      Appearance: She is obese.  Cardiovascular:     Rate and Rhythm: Normal rate and regular rhythm.     Pulses: Normal pulses.     Heart sounds: Normal heart sounds.  Pulmonary:     Effort: Pulmonary effort is normal.     Breath sounds: Normal breath sounds.  Neurological:     Mental Status: She is alert.           Assessment & Plan:  Her evening glucoses are adequate but her morning glucoses are too high. We will increase the Farxiga 5 mg to BID before meals. She will report back in 2 weeks.  Gershon Crane, MD

## 2023-08-21 ENCOUNTER — Encounter: Payer: Self-pay | Admitting: Family Medicine

## 2023-08-21 NOTE — Telephone Encounter (Signed)
Have her talk to her insurance company to ask them to list all diabetes oral meds they will cover

## 2023-08-29 MED ORDER — PIOGLITAZONE HCL 45 MG PO TABS: ORAL_TABLET | ORAL | 11 refills | Status: DC

## 2023-08-29 NOTE — Telephone Encounter (Signed)
I understand. Cancel the [place]. Instead call in Actos 45 mg (it comes generic) to take once every morning, #30 with 11 rf.

## 2023-09-14 ENCOUNTER — Other Ambulatory Visit: Payer: Self-pay | Admitting: Family Medicine

## 2023-09-16 NOTE — Telephone Encounter (Signed)
Medication: Atorvastatin 10 mg  Directions: Take 1 tablet by mouth daily  Last given: 11/18/22 Number refills: 3 Last o/v: 08/20/23 Follow up: Not scheduled CPE due Jan 2025 Labs: 11/25/22

## 2023-10-13 ENCOUNTER — Other Ambulatory Visit: Payer: Self-pay | Admitting: Family Medicine

## 2023-10-20 ENCOUNTER — Other Ambulatory Visit: Payer: Self-pay | Admitting: Family Medicine

## 2023-11-12 ENCOUNTER — Other Ambulatory Visit: Payer: Self-pay | Admitting: Family Medicine

## 2023-11-13 ENCOUNTER — Other Ambulatory Visit: Payer: Self-pay | Admitting: Family Medicine

## 2023-11-27 ENCOUNTER — Other Ambulatory Visit: Payer: Self-pay | Admitting: Family Medicine

## 2023-12-05 ENCOUNTER — Ambulatory Visit (INDEPENDENT_AMBULATORY_CARE_PROVIDER_SITE_OTHER): Payer: Self-pay | Admitting: Family Medicine

## 2023-12-05 VITALS — BP 100/78 | HR 76 | Temp 98.4°F | Ht 62.5 in | Wt 208.0 lb

## 2023-12-05 DIAGNOSIS — Z0000 Encounter for general adult medical examination without abnormal findings: Secondary | ICD-10-CM

## 2023-12-05 DIAGNOSIS — E039 Hypothyroidism, unspecified: Secondary | ICD-10-CM

## 2023-12-05 MED ORDER — WEGOVY 0.25 MG/0.5ML ~~LOC~~ SOAJ: 0.2500 mg | SUBCUTANEOUS | 5 refills | Status: DC

## 2023-12-05 MED ORDER — ATORVASTATIN CALCIUM 10 MG PO TABS: 10.0000 mg | ORAL_TABLET | Freq: Every day | ORAL | 3 refills | Status: DC

## 2023-12-05 NOTE — Progress Notes (Signed)
   Subjective:    Patient ID: Gabriela Conley, female    DOB: 08/11/1966, 58 y.o.   MRN: [id number]  HPI Here for a well exam. She feels well in general but she asks for help with losing weight. She follows a strict diet and she walks every day for exercise, but she cannot lose any weight.    Review of Systems  Constitutional: Negative.   HENT: Negative.    Eyes: Negative.   Respiratory: Negative.    Cardiovascular: Negative.   Gastrointestinal: Negative.   Genitourinary:  Negative for decreased urine volume, difficulty urinating, dyspareunia, dysuria, enuresis, flank pain, frequency, hematuria, pelvic pain and urgency.  Musculoskeletal: Negative.   Skin: Negative.   Neurological: Negative.  Negative for headaches.  Psychiatric/Behavioral: Negative.         Objective:   Physical Exam Constitutional:      General: She is not in acute distress.    Appearance: She is well-developed. She is obese.  HENT:     Head: Normocephalic and atraumatic.     Right Ear: External ear normal.     Left Ear: External ear normal.     Nose: Nose normal.     Mouth/Throat:     Pharynx: No oropharyngeal exudate.  Eyes:     General: No scleral icterus.    Conjunctiva/sclera: Conjunctivae normal.     Pupils: Pupils are equal, round, and reactive to light.  Neck:     Thyroid: No thyromegaly.     Vascular: No JVD.  Cardiovascular:     Rate and Rhythm: Normal rate and regular rhythm.     Pulses: Normal pulses.     Heart sounds: Normal heart sounds. No murmur heard.    No friction rub. No gallop.  Pulmonary:     Effort: Pulmonary effort is normal. No respiratory distress.     Breath sounds: Normal breath sounds. No wheezing or rales.  Chest:     Chest wall: No tenderness.  Abdominal:     General: Bowel sounds are normal. There is no distension.     Palpations: Abdomen is soft. There is no mass.     Tenderness: There is no abdominal tenderness. There is no guarding or rebound.  Musculoskeletal:         General: No tenderness. Normal range of motion.     Cervical back: Normal range of motion and neck supple.  Lymphadenopathy:     Cervical: No cervical adenopathy.  Skin:    General: Skin is warm and dry.     Findings: No erythema or rash.  Neurological:     General: No focal deficit present.     Mental Status: She is alert and oriented to person, place, and time.     Cranial Nerves: No cranial nerve deficit.     Motor: No abnormal muscle tone.     Coordination: Coordination normal.     Deep Tendon Reflexes: Reflexes are normal and symmetric. Reflexes normal.  Psychiatric:        Mood and Affect: Mood normal.        Behavior: Behavior normal.        Thought Content: Thought content normal.        Judgment: Judgment normal.           Assessment & Plan:  Well exam. We discussed diet and exercise. Get fasting labs. She will try Weygovy, starting at 0.25 mg weekly.  Gershon Crane, MD

## 2023-12-08 ENCOUNTER — Other Ambulatory Visit (INDEPENDENT_AMBULATORY_CARE_PROVIDER_SITE_OTHER): Payer: Self-pay

## 2023-12-08 DIAGNOSIS — Z0000 Encounter for general adult medical examination without abnormal findings: Secondary | ICD-10-CM

## 2023-12-08 DIAGNOSIS — E039 Hypothyroidism, unspecified: Secondary | ICD-10-CM

## 2023-12-08 LAB — BASIC METABOLIC PANEL
BUN: 15 mg/dL (ref 6–23)
CO2: 26 meq/L (ref 19–32)
Calcium: 9.5 mg/dL (ref 8.4–10.5)
Chloride: 103 meq/L (ref 96–112)
Creatinine, Ser: 0.76 mg/dL (ref 0.40–1.20)
GFR: 87.75 mL/min (ref >60.00)
Glucose, Bld: 159 mg/dL — ABNORMAL HIGH (ref 70–99)
Potassium: 4 meq/L (ref 3.5–5.1)
Sodium: 140 meq/L (ref 135–145)

## 2023-12-08 LAB — HEPATIC FUNCTION PANEL
ALT: 21 U/L (ref 0–35)
AST: 16 U/L (ref 0–37)
Albumin: 4.2 g/dL (ref 3.5–5.2)
Alkaline Phosphatase: 76 U/L (ref 39–117)
Bilirubin, Direct: 0.1 mg/dL (ref 0.0–0.3)
Total Bilirubin: 0.3 mg/dL (ref 0.2–1.2)
Total Protein: 7.3 g/dL (ref 6.0–8.3)

## 2023-12-08 LAB — CBC WITH DIFFERENTIAL/PLATELET
Basophils Absolute: 0 10*3/uL (ref 0.0–0.1)
Basophils Relative: 0.6 % (ref 0.0–3.0)
Eosinophils Absolute: 0.4 10*3/uL (ref 0.0–0.7)
Eosinophils Relative: 6.8 % — ABNORMAL HIGH (ref 0.0–5.0)
HCT: 36.4 % (ref 36.0–46.0)
Hemoglobin: 11.9 g/dL — ABNORMAL LOW (ref 12.0–15.0)
Lymphocytes Relative: 24.5 % (ref 12.0–46.0)
Lymphs Abs: 1.6 10*3/uL (ref 0.7–4.0)
MCHC: 32.6 g/dL (ref 30.0–36.0)
MCV: 85 fL (ref 78.0–100.0)
Monocytes Absolute: 0.6 10*3/uL (ref 0.1–1.0)
Monocytes Relative: 9.2 % (ref 3.0–12.0)
Neutro Abs: 3.7 10*3/uL (ref 1.4–7.7)
Neutrophils Relative %: 58.9 % (ref 43.0–77.0)
Platelets: 253 10*3/uL (ref 150.0–400.0)
RBC: 4.28 Mil/uL (ref 3.87–5.11)
RDW: 15.7 % — ABNORMAL HIGH (ref 11.5–15.5)
WBC: 6.3 10*3/uL (ref 4.0–10.5)

## 2023-12-08 LAB — LIPID PANEL
Cholesterol: 165 mg/dL (ref 0–200)
HDL: 42.3 mg/dL (ref >39.00)
LDL Cholesterol: 78 mg/dL (ref 0–99)
NonHDL: 122.98
Total CHOL/HDL Ratio: 4
Triglycerides: 223 mg/dL — ABNORMAL HIGH (ref 0.0–149.0)
VLDL: 44.6 mg/dL — ABNORMAL HIGH (ref 0.0–40.0)

## 2023-12-08 LAB — T4, FREE: Free T4: 1.11 ng/dL (ref 0.60–1.60)

## 2023-12-08 LAB — TSH: TSH: 0.23 u[IU]/mL — ABNORMAL LOW (ref 0.35–5.50)

## 2023-12-08 LAB — T3, FREE: T3, Free: 3.5 pg/mL (ref 2.3–4.2)

## 2023-12-08 LAB — HEMOGLOBIN A1C: Hgb A1c MFr Bld: 7.8 % — ABNORMAL HIGH (ref 4.6–6.5)

## 2023-12-10 ENCOUNTER — Other Ambulatory Visit: Payer: Self-pay | Admitting: Medical Genetics

## 2023-12-11 ENCOUNTER — Telehealth: Payer: Self-pay

## 2023-12-11 NOTE — Telephone Encounter (Signed)
 Pharmacy Patient Advocate Encounter   Received notification from  Fax from [facility]  that prior authorization for Wegovy  is required/requested.   Pt is type 2 diabetic

## 2023-12-12 MED ORDER — OZEMPIC (0.25 OR 0.5 MG/DOSE) 2 MG/3ML ~~LOC~~ SOPN: 0.2500 mg | PEN_INJECTOR | SUBCUTANEOUS | 5 refills | Status: DC

## 2023-12-12 NOTE — Telephone Encounter (Signed)
 I agree. I changed the RX to Ozempic  and sent it in

## 2023-12-17 ENCOUNTER — Other Ambulatory Visit (HOSPITAL_COMMUNITY): Payer: Self-pay

## 2023-12-19 ENCOUNTER — Other Ambulatory Visit (HOSPITAL_COMMUNITY)
Admission: RE | Admit: 2023-12-19 | Discharge: 2023-12-19 | Disposition: A | Discharge status: Home / Self Care | Payer: Self-pay | Source: Ambulatory Visit | Attending: Oncology | Admitting: Oncology

## 2023-12-31 LAB — GENECONNECT MOLECULAR SCREEN: Genetic Analysis Overall Interpretation: NEGATIVE

## 2024-01-06 ENCOUNTER — Other Ambulatory Visit (HOSPITAL_COMMUNITY): Payer: Self-pay

## 2024-01-09 ENCOUNTER — Other Ambulatory Visit: Payer: Self-pay | Admitting: Family Medicine

## 2024-01-09 MED ORDER — METFORMIN HCL 1000 MG PO TABS: 1000.0000 mg | ORAL_TABLET | Freq: Every day | ORAL | 0 refills | Status: DC

## 2024-02-13 ENCOUNTER — Other Ambulatory Visit: Payer: Self-pay | Admitting: Family Medicine

## 2024-02-17 ENCOUNTER — Other Ambulatory Visit: Payer: Self-pay | Admitting: Family Medicine

## 2024-02-23 ENCOUNTER — Other Ambulatory Visit: Payer: Self-pay | Admitting: Family Medicine

## 2024-02-27 ENCOUNTER — Encounter: Payer: Self-pay | Admitting: Family Medicine

## 2024-02-27 ENCOUNTER — Ambulatory Visit (INDEPENDENT_AMBULATORY_CARE_PROVIDER_SITE_OTHER): Payer: Self-pay | Admitting: Family Medicine

## 2024-02-27 VITALS — BP 120/74 | HR 81 | Temp 98.5°F | Wt 202.0 lb

## 2024-02-27 DIAGNOSIS — Z7985 Long-term (current) use of injectable non-insulin antidiabetic drugs: Secondary | ICD-10-CM

## 2024-02-27 DIAGNOSIS — E669 Obesity, unspecified: Secondary | ICD-10-CM

## 2024-02-27 DIAGNOSIS — E1169 Type 2 diabetes mellitus with other specified complication: Secondary | ICD-10-CM

## 2024-02-27 MED ORDER — OZEMPIC (0.25 OR 0.5 MG/DOSE) 2 MG/3ML ~~LOC~~ SOPN: 0.5000 mg | PEN_INJECTOR | SUBCUTANEOUS | 5 refills | Status: DC

## 2024-02-27 NOTE — Progress Notes (Signed)
   Subjective:    Patient ID: Gabriela Conley, female    DOB: 03/27/1966, 58 y.o.   MRN: [id number]  HPI Here to follow up on weight loss efforts. She has been using Semaglutiode 0.25 mg weekly, and she is doing well. She experienced some mild nausea the first week, but this resolved. She has lost 6 lbs so far.    Review of Systems  Constitutional: Negative.   Respiratory: Negative.    Cardiovascular: Negative.        Objective:   Physical Exam Constitutional:      Appearance: Normal appearance.  Cardiovascular:     Rate and Rhythm: Normal rate and regular rhythm.     Pulses: Normal pulses.     Heart sounds: Normal heart sounds.  Neurological:     Mental Status: She is alert.           Assessment & Plan:  Obesity. We will increase the Semaglutide  to 0.5 mg weekly. Recheck in 3 months.  Corita Diego, MD

## 2024-03-10 ENCOUNTER — Telehealth: Payer: Self-pay

## 2024-03-10 ENCOUNTER — Other Ambulatory Visit (HOSPITAL_COMMUNITY): Payer: Self-pay

## 2024-03-10 ENCOUNTER — Encounter: Payer: Self-pay | Admitting: Family Medicine

## 2024-03-10 NOTE — Telephone Encounter (Signed)
 Pharmacy Patient Advocate Encounter   Received notification from CoverMyMeds that prior authorization for Ozempic  0.25 is required/requested.   Insurance verification completed.   The patient is insured through [employer] .   Per test claim: PA required; PA submitted to above mentioned insurance via CoverMyMeds Key/confirmation #/EOC [facility] Status is pending

## 2024-03-11 NOTE — Telephone Encounter (Signed)
 Please send a PA for Ozempic  to pt plan

## 2024-03-12 NOTE — Telephone Encounter (Signed)
 Please update on this PA, there is no encounter on for 03/11/24

## 2024-03-14 ENCOUNTER — Other Ambulatory Visit: Payer: Self-pay | Admitting: Family Medicine

## 2024-03-15 ENCOUNTER — Other Ambulatory Visit (HOSPITAL_COMMUNITY): Payer: Self-pay

## 2024-03-15 NOTE — Telephone Encounter (Signed)
 Pharmacy Patient Advocate Encounter  Received notification from [employer] that Prior Authorization for Ozempic  2 has been APPROVED from 03/15/24 to 03/15/25. Ran test claim, Copay is $24.99. This test claim was processed through [facility]- copay amounts may vary at other pharmacies due to pharmacy/plan contracts, or as the patient moves through the different stages of their insurance plan.   PA #/Case ID/Reference #: [id number]

## 2024-03-15 NOTE — Telephone Encounter (Signed)
 Pt.notified

## 2024-03-17 NOTE — Telephone Encounter (Signed)
 Noted.

## 2024-03-26 ENCOUNTER — Telehealth: Admitting: Physician Assistant

## 2024-03-26 DIAGNOSIS — J020 Streptococcal pharyngitis: Secondary | ICD-10-CM

## 2024-03-26 MED ORDER — AMOXICILLIN-POT CLAVULANATE 875-125 MG PO TABS: 1.0000 | ORAL_TABLET | Freq: Two times a day (BID) | ORAL | 0 refills | Status: DC

## 2024-03-26 NOTE — Progress Notes (Signed)
 Virtual Visit Consent   Gabriela Conley, you are scheduled for a virtual visit with a [facility] provider today. Just as with appointments in the office, your consent must be obtained to participate. Your consent will be active for this visit and any virtual visit you may have with one of our providers in the next 365 days. If you have a MyChart account, a copy of this consent can be sent to you electronically.  As this is a virtual visit, video technology does not allow for your provider to perform a traditional examination. This may limit your provider's ability to fully assess your condition. If your provider identifies any concerns that need to be evaluated in person or the need to arrange testing (such as labs, EKG, etc.), we will make arrangements to do so. Although advances in technology are sophisticated, we cannot ensure that it will always work on either your end or our end. If the connection with a video visit is poor, the visit may have to be switched to a telephone visit. With either a video or telephone visit, we are not always able to ensure that we have a secure connection.  By engaging in this virtual visit, you consent to the provision of healthcare and authorize for your insurance to be billed (if applicable) for the services provided during this visit. Depending on your insurance coverage, you may receive a charge related to this service.  I need to obtain your verbal consent now. Are you willing to proceed with your visit today? Pinkey Conley has provided verbal consent on 03/26/2024 for a virtual visit (video or telephone). Angelia Kelp, PA-C  Date: 03/26/2024 8:06 AM   Virtual Visit via Video Note   I, Angelia Kelp, connected with  Gabriela Conley  ([id number], 1966/07/03) on 03/26/24 at  8:00 AM EDT by a video-enabled telemedicine application and verified that I am speaking with the correct person using two identifiers.  Location: Patient: Virtual Visit Location Patient:  Home Provider: Virtual Visit Location Provider: Home Office   I discussed the limitations of evaluation and management by telemedicine and the availability of in person appointments. The patient expressed understanding and agreed to proceed.    History of Present Illness: Gabriela Conley is a 58 y.o. who identifies as a female who was assigned female at birth, and is being seen today for sore throat.  HPI: Sore Throat  This is a new problem. The current episode started in the past 7 days. The problem has been gradually worsening. Maximum temperature: subjective fever. The pain is moderate. Associated symptoms include congestion, coughing, headaches, a hoarse voice, a plugged ear sensation, swollen glands and trouble swallowing. Pertinent negatives include no diarrhea, ear discharge, ear pain, shortness of breath or vomiting. She has tried acetaminophen for the symptoms. The treatment provided mild relief.     Problems:  Patient Active Problem List   Diagnosis Date Noted   Obesity (BMI 30-39.9) 02/27/2024   Dyslipidemia 11/07/2021   Type 2 diabetes mellitus with other specified complication (HCC) 11/07/2021   Eczema 11/07/2021   Psoriasis 10/05/2020   Anemia 01/02/2011   Hypothyroidism 01/02/2011   Anxiety 01/02/2011    Allergies:  Allergies  Allergen Reactions   Chocolate Anaphylaxis   Medications:  Current Outpatient Medications:    albuterol  (VENTOLIN  HFA) 108 (90 Base) MCG/ACT inhaler, Inhale 1-2 puffs into the lungs every 6 (six) hours as needed for wheezing or shortness of breath., Disp: 1 each, Rfl: 0   amoxicillin -clavulanate (AUGMENTIN ) 875-125  MG tablet, Take 1 tablet by mouth 2 (two) times daily., Disp: 20 tablet, Rfl: 0   atorvastatin  (LIPITOR) 10 MG tablet, Take 1 tablet (10 mg total) by mouth daily., Disp: 90 tablet, Rfl: 3   augmented betamethasone  dipropionate (DIPROLENE -AF) 0.05 % cream, Apply topically 2 (two) times daily., Disp: 50 g, Rfl: 5   fluticasone  (FLONASE ) 50  MCG/ACT nasal spray, Place 1 spray into both nostrils daily., Disp: 15.8 mL, Rfl: 0   glipiZIDE  (GLUCOTROL ) 10 MG tablet, TAKE 1 TABLET (10 MG TOTAL) BY MOUTH TWICE A DAY BEFORE A MEAL, Disp: 180 tablet, Rfl: 1   levothyroxine  (SYNTHROID ) 175 MCG tablet, TAKE 1 TABLET BY MOUTH EVERY DAY BEFORE BREAKFAST, Disp: 90 tablet, Rfl: 0   metFORMIN  (GLUCOPHAGE ) 1000 MG tablet, Take 1 tablet (1,000 mg total) by mouth daily with breakfast., Disp: 180 tablet, Rfl: 0   omeprazole  (PRILOSEC) 40 MG capsule, TAKE 1 CAPSULE (40 MG TOTAL) BY MOUTH DAILY., Disp: 90 capsule, Rfl: 0   pioglitazone  (ACTOS ) 45 MG tablet, TAKE 1 TABLET BY MOUTH EVERY DAY IN THE MORNING, Disp: 90 tablet, Rfl: 1   Semaglutide ,0.25 or 0.5MG /DOS, (OZEMPIC , 0.25 OR 0.5 MG/DOSE,) 2 MG/3ML SOPN, Inject 0.5 mg into the skin once a week., Disp: 3 mL, Rfl: 5   venlafaxine  XR (EFFEXOR -XR) 150 MG 24 hr capsule, TAKE 1 CAPSULE BY MOUTH DAILY WITH BREAKFAST., Disp: 90 capsule, Rfl: 0  Observations/Objective: Patient is well-developed, well-nourished in no acute distress.  Resting comfortably at home.  Head is normocephalic, atraumatic.  No labored breathing.  Speech is clear and coherent with logical content.  Patient is alert and oriented at baseline.    Assessment and Plan: 1. Strep pharyngitis (Primary) - amoxicillin -clavulanate (AUGMENTIN ) 875-125 MG tablet; Take 1 tablet by mouth 2 (two) times daily.  Dispense: 20 tablet; Refill: 0  - Suspect strep throat - Augmentin  prescribed - Tylenol and Ibuprofen  alternating every 4 hours - Salt water gargles - Chloraseptic spray - Liquid and soft food diet - Push fluids - New toothbrush in 3 days - Seek in person evaluation if not improving or if symptoms worsen   Follow Up Instructions: I discussed the assessment and treatment plan with the patient. The patient was provided an opportunity to ask questions and all were answered. The patient agreed with the plan and demonstrated an  understanding of the instructions.  A copy of instructions were sent to the patient via MyChart unless otherwise noted below.    The patient was advised to call back or seek an in-person evaluation if the symptoms worsen or if the condition fails to improve as anticipated.    Angelia Kelp, PA-C

## 2024-03-26 NOTE — Patient Instructions (Signed)
 Levonne Rear, thank you for joining Angelia Kelp, PA-C for today's virtual visit.  While this provider is not your primary care provider (PCP), if your PCP is located in our provider database this encounter information will be shared with them immediately following your visit.   A [facility] MyChart account gives you access to today's visit and all your visits, tests, and labs performed at [facility] " click here if you don't have a [facility] MyChart account or go to [email or web address]  Consent: (Patient) Gabriela Conley provided verbal consent for this virtual visit at the beginning of the encounter.  Current Medications:  Current Outpatient Medications:    amoxicillin -clavulanate (AUGMENTIN ) 875-125 MG tablet, Take 1 tablet by mouth 2 (two) times daily., Disp: 20 tablet, Rfl: 0   albuterol  (VENTOLIN  HFA) 108 (90 Base) MCG/ACT inhaler, Inhale 1-2 puffs into the lungs every 6 (six) hours as needed for wheezing or shortness of breath., Disp: 1 each, Rfl: 0   atorvastatin  (LIPITOR) 10 MG tablet, Take 1 tablet (10 mg total) by mouth daily., Disp: 90 tablet, Rfl: 3   augmented betamethasone  dipropionate (DIPROLENE -AF) 0.05 % cream, Apply topically 2 (two) times daily., Disp: 50 g, Rfl: 5   fluticasone  (FLONASE ) 50 MCG/ACT nasal spray, Place 1 spray into both nostrils daily., Disp: 15.8 mL, Rfl: 0   glipiZIDE  (GLUCOTROL ) 10 MG tablet, TAKE 1 TABLET (10 MG TOTAL) BY MOUTH TWICE A DAY BEFORE A MEAL, Disp: 180 tablet, Rfl: 1   levothyroxine  (SYNTHROID ) 175 MCG tablet, TAKE 1 TABLET BY MOUTH EVERY DAY BEFORE BREAKFAST, Disp: 90 tablet, Rfl: 0   metFORMIN  (GLUCOPHAGE ) 1000 MG tablet, Take 1 tablet (1,000 mg total) by mouth daily with breakfast., Disp: 180 tablet, Rfl: 0   omeprazole  (PRILOSEC) 40 MG capsule, TAKE 1 CAPSULE (40 MG TOTAL) BY MOUTH DAILY., Disp: 90 capsule, Rfl: 0   pioglitazone  (ACTOS ) 45 MG tablet, TAKE 1 TABLET BY MOUTH EVERY DAY IN THE MORNING, Disp: 90 tablet,  Rfl: 1   Semaglutide ,0.25 or 0.5MG /DOS, (OZEMPIC , 0.25 OR 0.5 MG/DOSE,) 2 MG/3ML SOPN, Inject 0.5 mg into the skin once a week., Disp: 3 mL, Rfl: 5   venlafaxine  XR (EFFEXOR -XR) 150 MG 24 hr capsule, TAKE 1 CAPSULE BY MOUTH DAILY WITH BREAKFAST., Disp: 90 capsule, Rfl: 0   Medications ordered in this encounter:  Meds ordered this encounter  Medications   amoxicillin -clavulanate (AUGMENTIN ) 875-125 MG tablet    Sig: Take 1 tablet by mouth 2 (two) times daily.    Dispense:  20 tablet    Refill:  0    Supervising Provider:   LAMPTEY, PHILIP O [[id number]]     *If you need refills on other medications prior to your next appointment, please contact your pharmacy*  Follow-Up: Call back or seek an in-person evaluation if the symptoms worsen or if the condition fails to improve as anticipated.  [facility] [phone]  Other Instructions Strep Throat, Adult Strep throat is an infection in the throat that is caused by bacteria. It is common during the cold months of the year. It mostly affects children who are 32-26 years old. However, people of all ages can get it at any time of the year. This infection spreads from person to person (is contagious) through coughing, sneezing, or having close contact. Your health care provider may use other names to describe the infection. When strep throat affects the tonsils, it is called tonsillitis. When it affects the back of the throat, it is called pharyngitis.  What are the causes? This condition is caused by the Streptococcus pyogenes bacteria. What increases the risk? You are more likely to develop this condition if: You care for school-age children, or are around school-age children. Children are more likely to get strep throat and may spread it to others. You spend time in crowded places where the infection can spread easily. You have close contact with someone who has strep throat. What are the signs or symptoms? Symptoms of this  condition include: Fever or chills. Redness, swelling, or pain in the tonsils or throat. Pain or difficulty when swallowing. White or yellow spots on the tonsils or throat. Tender glands in the neck and under the jaw. Bad smelling breath. Red rash all over the body. This is rare. How is this diagnosed? This condition is diagnosed by tests that check for the presence and the amount of bacteria that cause strep throat. They are: Rapid strep test. Your throat is swabbed and checked for the presence of bacteria. Results are usually ready in minutes. Throat culture test. Your throat is swabbed. The sample is placed in a cup that allows infections to grow. Results are usually ready in 1 or 2 days. How is this treated? This condition may be treated with: Medicines that kill germs (antibiotics). Medicines that relieve pain or fever. These include: Ibuprofen  or acetaminophen. Aspirin, only for people who are over the age of 38. Throat lozenges. Throat sprays. Follow these instructions at home: Medicines  Take over-the-counter and prescription medicines only as told by your health care provider. Take your antibiotic medicine as told by your health care provider. Do not stop taking the antibiotic even if you start to feel better. Eating and drinking  If you have trouble swallowing, try eating soft foods until your sore throat feels better. Drink enough fluid to keep your urine pale yellow. To help relieve pain, you may have: Warm fluids, such as soup and tea. Cold fluids, such as frozen desserts or popsicles. General instructions Gargle with a salt-water mixture 3-4 times a day or as needed. To make a salt-water mixture, completely dissolve -1 tsp (3-6 g) of salt in 1 cup (237 mL) of warm water. Get plenty of rest. Stay home from work or school until you have been taking antibiotics for 24 hours. Do not use any products that contain nicotine or tobacco. These products include cigarettes,  chewing tobacco, and vaping devices, such as e-cigarettes. If you need help quitting, ask your health care provider. It is up to you to get your test results. Ask your health care provider, or the department that is doing the test, when your results will be ready. Keep all follow-up visits. This is important. How is this prevented?  Do not share food, drinking cups, or personal items that could cause the infection to spread to other people. Wash your hands often with soap and water for at least 20 seconds. If soap and water are not available, use hand sanitizer. Make sure that all people in your house wash their hands well. Have family members tested if they have a sore throat or fever. They may need an antibiotic if they have strep throat. Contact a health care provider if: You have swelling in your neck that keeps getting bigger. You develop a rash, cough, or earache. You cough up a thick mucus that is green, yellow-brown, or bloody. You have pain or discomfort that does not get better with medicine. Your symptoms seem to be getting worse. You have  a fever. Get help right away if: You have new symptoms, such as vomiting, severe headache, stiff or painful neck, chest pain, or shortness of breath. You have severe throat pain, drooling, or changes in your voice. You have swelling of the neck, or the skin on the neck becomes red and tender. You have signs of dehydration, such as tiredness (fatigue), dry mouth, and decreased urination. You become increasingly sleepy, or you cannot wake up completely. Your joints become red or painful. These symptoms may represent a serious problem that is an emergency. Do not wait to see if the symptoms will go away. Get medical help right away. Call your local emergency services (911 in the [place] Do not drive yourself to the hospital. Summary Strep throat is an infection in the throat that is caused by the Streptococcus pyogenes bacteria. This infection is  spread from person to person (is contagious) through coughing, sneezing, or having close contact. Take your medicines, including antibiotics, as told by your health care provider. Do not stop taking the antibiotic even if you start to feel better. To prevent the spread of germs, wash your hands well with soap and water. Have others do the same. Do not share food, drinking cups, or personal items. Get help right away if you have new symptoms, such as vomiting, severe headache, stiff or painful neck, chest pain, or shortness of breath. This information is not intended to replace advice given to you by your health care provider. Make sure you discuss any questions you have with your health care provider. Document Revised: 02/13/2021 Document Reviewed: 02/13/2021 Elsevier Patient Education  2024 [employer]   If you have been instructed to have an in-person evaluation today at a local Urgent Care facility, please use the link below. It will take you to a list of all of our available [facility], including address, phone number and hours of operation. Please do not delay care.  [facility]  If you or a family member do not have a primary care provider, use the link below to schedule a visit and establish care. When you choose a [facility] primary care physician or advanced practice provider, you gain a long-term partner in health. Find a Primary Care Provider  Learn more about [facility]'s in-office and virtual care options: [facility] - Get Care Now

## 2024-04-27 ENCOUNTER — Encounter: Payer: Self-pay | Admitting: Family Medicine

## 2024-04-28 MED ORDER — VENLAFAXINE HCL ER 150 MG PO CP24: 150.0000 mg | ORAL_CAPSULE | Freq: Every day | ORAL | 1 refills | Status: AC

## 2024-04-28 MED ORDER — METFORMIN HCL 1000 MG PO TABS: 1000.0000 mg | ORAL_TABLET | Freq: Every day | ORAL | 1 refills | Status: DC

## 2024-04-28 MED ORDER — ATORVASTATIN CALCIUM 10 MG PO TABS: 10.0000 mg | ORAL_TABLET | Freq: Every day | ORAL | 1 refills | Status: AC

## 2024-04-28 MED ORDER — LEVOTHYROXINE SODIUM 175 MCG PO TABS: 175.0000 ug | ORAL_TABLET | Freq: Every day | ORAL | 1 refills | Status: AC

## 2024-04-28 MED ORDER — PIOGLITAZONE HCL 45 MG PO TABS: ORAL_TABLET | ORAL | 1 refills | Status: AC

## 2024-04-28 MED ORDER — GLIPIZIDE 10 MG PO TABS: 10.0000 mg | ORAL_TABLET | Freq: Two times a day (BID) | ORAL | 1 refills | Status: DC

## 2024-04-28 MED ORDER — OMEPRAZOLE 40 MG PO CPDR: 40.0000 mg | DELAYED_RELEASE_CAPSULE | Freq: Every day | ORAL | 1 refills | Status: AC

## 2024-04-28 MED ORDER — OZEMPIC (0.25 OR 0.5 MG/DOSE) 2 MG/3ML ~~LOC~~ SOPN: 0.5000 mg | PEN_INJECTOR | SUBCUTANEOUS | 5 refills | Status: DC

## 2024-05-05 ENCOUNTER — Encounter: Payer: Self-pay | Admitting: Family Medicine

## 2024-05-06 ENCOUNTER — Other Ambulatory Visit: Payer: Self-pay

## 2024-05-06 MED ORDER — METFORMIN HCL 1000 MG PO TABS: 1000.0000 mg | ORAL_TABLET | Freq: Every day | ORAL | 0 refills | Status: DC

## 2024-05-11 ENCOUNTER — Encounter: Payer: Self-pay | Admitting: Family Medicine

## 2024-05-11 ENCOUNTER — Ambulatory Visit: Admitting: Family Medicine

## 2024-05-11 VITALS — BP 126/80 | HR 92 | Temp 98.4°F | Ht 62.5 in | Wt 191.8 lb

## 2024-05-11 DIAGNOSIS — K5903 Drug induced constipation: Secondary | ICD-10-CM

## 2024-05-11 NOTE — Progress Notes (Signed)
   Subjective:    Patient ID: Gabriela Conley, female    DOB: [birth year], 58 y.o.   MRN: [id number]  HPI Here complaining of constipation and low back pain for the past 3 weeks. She has never been constipated before in her life. She normally has 2 BM's every day, but now she passing only 2 per week. She feels bloated, and she has a mild aching pain in the lower abdomen and the lower back. She has nausea at times but has not vomited. No fever. No urinary symptoms. She drinks plenty plenty of water daily. She has been taking Dulcolax and Senekot daily, and this has helped a little. Of note she has been taking Ozempic  shots for the past 5 months.  Review of Systems  Constitutional: Negative.   Respiratory: Negative.    Cardiovascular: Negative.   Gastrointestinal:  Positive for abdominal distention, abdominal pain, constipation and nausea. Negative for blood in stool, diarrhea, rectal pain and vomiting.  Genitourinary: Negative.   Musculoskeletal:  Positive for back pain.       Objective:   Physical Exam Constitutional:      Appearance: Normal appearance. She is not ill-appearing.  Cardiovascular:     Rate and Rhythm: Normal rate and regular rhythm.     Pulses: Normal pulses.     Heart sounds: Normal heart sounds.  Pulmonary:     Effort: Pulmonary effort is normal.     Breath sounds: Normal breath sounds.  Abdominal:     General: Abdomen is flat. Bowel sounds are normal. There is no distension.     Palpations: Abdomen is soft. There is no mass.     Tenderness: There is no abdominal tenderness. There is no right CVA tenderness, left CVA tenderness, guarding or rebound.     Hernia: No hernia is present.  Neurological:     Mental Status: She is alert.           Assessment & Plan:  She has constipation, and I think this is a side effect of the Ozempic . We agreed that she will stop the Ozempic , at least for now. She will use Miralax BID and Sennkot BID. Follow up in 2 weeks.  Garnette Olmsted, MD

## 2024-05-14 ENCOUNTER — Other Ambulatory Visit: Payer: Self-pay | Admitting: Family Medicine

## 2024-06-08 ENCOUNTER — Ambulatory Visit: Payer: Self-pay

## 2024-06-08 ENCOUNTER — Encounter: Payer: Self-pay | Admitting: Family Medicine

## 2024-06-08 NOTE — Telephone Encounter (Signed)
 FYI Only or Action Required?: FYI only for provider.  Patient was last seen in primary care on 05/11/2024 by Johnny Garnette LABOR, MD.  Called Nurse Triage reporting Rectal Bleeding.  Symptoms began x 2 weeks.  Interventions attempted: Nothing.  Symptoms are: unchanged.  Triage Disposition: See Physician Within 24 Hours  Patient/caregiver understands and will follow disposition?: Yes   **Appointment scheduled for 8/6**           Copied from CRM #[id number]. Topic: Clinical - Red Word Triage >> Jun 08, 2024  4:11 PM Rea C wrote: Red Word that prompted transfer to Nurse Triage: constipation with clots of blood in the toilet,  This was happening while patient was taking ozempic . Patient hasn't taken it for a bit. But, now anytime she goes to the bathroom there is bleeding with stool and pain. Reason for Disposition  MODERATE rectal bleeding (e.g., small blood clots, passing blood without stool, or toilet water turns red)  Answer Assessment - Initial Assessment Questions 1. APPEARANCE of BLOOD: What color is it? Is it passed separately, on the surface of the stool, or mixed in with the stool?       Deep red color earlier today   2. AMOUNT: How much blood was passed?      Unknown as water was red  3. FREQUENCY: How many times has blood been passed with the stools?      Passed blood with each BM.  4. ONSET: When was the blood first seen in the stools? (Days or weeks)       Ongoing for 2 weeks  5. DIARRHEA: Is there also some diarrhea? If Yes, ask: How many diarrhea stools in the past 24 hours?      No  6. CONSTIPATION: Do you have constipation? If Yes, ask: How bad is it?     No , last BM was today  7. RECURRENT SYMPTOMS: Have you had blood in your stools before? If Yes, ask: When was the last time? and What happened that time?       Yes ongoing x 2 weeks  8. BLOOD THINNERS: Do you take any blood thinners? (e.g., aspirin, clopidogrel / Plavix,  coumadin, heparin). Notes: Other strong blood thinners include: Arixtra (fondaparinux), Eliquis (apixaban), Pradaxa (dabigatran), and Xarelto (rivaroxaban).     No   9. OTHER SYMPTOMS: Do you have any other symptoms?  (e.g., abdomen pain, vomiting, dizziness, fever)   No, has complaints of back pain however.   Patient states this happens while taking Ozempic ; not currently taking the medication. She states small clots are present with BM as well.  Protocols used: Rectal Bleeding-A-AH

## 2024-06-09 ENCOUNTER — Ambulatory Visit: Admitting: Family Medicine

## 2024-06-09 VITALS — BP 146/86 | HR 78 | Temp 98.3°F | Wt 195.0 lb

## 2024-06-09 DIAGNOSIS — R1032 Left lower quadrant pain: Secondary | ICD-10-CM | POA: Diagnosis not present

## 2024-06-09 MED ORDER — CIPROFLOXACIN HCL 500 MG PO TABS: 500.0000 mg | ORAL_TABLET | Freq: Two times a day (BID) | ORAL | 0 refills | Status: AC

## 2024-06-09 MED ORDER — METRONIDAZOLE 500 MG PO TABS: 500.0000 mg | ORAL_TABLET | Freq: Three times a day (TID) | ORAL | 0 refills | Status: DC

## 2024-06-09 NOTE — Telephone Encounter (Signed)
 Pt was seen by Dr Johnny this afternoon for this problem

## 2024-06-09 NOTE — Progress Notes (Signed)
   Subjective:    Patient ID: Gabriela Conley, female    DOB: 11/10/1965, 58 y.o.   MRN: [id number]  HPI Here for sharp, sometimes severe, pains in the left lower quadrant of the abdomen and left flank. These started 3 days ago. She has never felt this before. Her BM's are normal, although she has seen some dark blood in the stool at times. She has some nausea but has not vomited. She has had low grade fevers. No urinary symptoms. Taking Tylenol . She had a colonoscopy in 2021 that did not mention any diverticula.    Review of Systems  Constitutional:  Positive for fever. Negative for diaphoresis.  Respiratory: Negative.    Cardiovascular: Negative.   Gastrointestinal:  Positive for abdominal pain, blood in stool and nausea. Negative for abdominal distention, constipation, diarrhea, rectal pain and vomiting.  Genitourinary: Negative.        Objective:   Physical Exam Constitutional:      Comments: In mild pain   Cardiovascular:     Rate and Rhythm: Normal rate and regular rhythm.     Pulses: Normal pulses.     Heart sounds: Normal heart sounds.  Pulmonary:     Effort: Pulmonary effort is normal.     Breath sounds: Normal breath sounds.  Abdominal:     General: Abdomen is flat. Bowel sounds are normal. There is no distension.     Palpations: Abdomen is soft. There is no mass.     Tenderness: There is no right CVA tenderness, left CVA tenderness, guarding or rebound.     Hernia: No hernia is present.     Comments: Moderately tender in the LLQ and left flank   Neurological:     Mental Status: She is alert.           Assessment & Plan:  This is most likely diverticulitis, and we will treat with 10 days of Cipro  and Metronidazole . We will check a CBC and BMET. Set up a CT of the abdomen and pelvis asap.  Garnette Olmsted, MD

## 2024-06-10 ENCOUNTER — Encounter (HOSPITAL_COMMUNITY): Payer: Self-pay

## 2024-06-10 ENCOUNTER — Other Ambulatory Visit: Payer: Self-pay

## 2024-06-10 ENCOUNTER — Ambulatory Visit: Payer: Self-pay

## 2024-06-10 ENCOUNTER — Emergency Department (HOSPITAL_COMMUNITY)

## 2024-06-10 ENCOUNTER — Emergency Department (HOSPITAL_COMMUNITY)
Admission: EM | Admit: 2024-06-10 | Discharge: 2024-06-10 | Disposition: A | Discharge status: Home / Self Care | Attending: Emergency Medicine | Admitting: Emergency Medicine

## 2024-06-10 DIAGNOSIS — Z7951 Long term (current) use of inhaled steroids: Secondary | ICD-10-CM | POA: Insufficient documentation

## 2024-06-10 DIAGNOSIS — R1084 Generalized abdominal pain: Secondary | ICD-10-CM

## 2024-06-10 DIAGNOSIS — Z7984 Long term (current) use of oral hypoglycemic drugs: Secondary | ICD-10-CM | POA: Insufficient documentation

## 2024-06-10 DIAGNOSIS — E119 Type 2 diabetes mellitus without complications: Secondary | ICD-10-CM | POA: Insufficient documentation

## 2024-06-10 DIAGNOSIS — R030 Elevated blood-pressure reading, without diagnosis of hypertension: Secondary | ICD-10-CM | POA: Insufficient documentation

## 2024-06-10 DIAGNOSIS — K449 Diaphragmatic hernia without obstruction or gangrene: Secondary | ICD-10-CM | POA: Insufficient documentation

## 2024-06-10 DIAGNOSIS — J45909 Unspecified asthma, uncomplicated: Secondary | ICD-10-CM | POA: Diagnosis not present

## 2024-06-10 LAB — CBC WITH DIFFERENTIAL/PLATELET
Abs Immature Granulocytes: 0.05 K/uL (ref 0.00–0.07)
Basophils Absolute: 0.1 K/uL (ref 0.0–0.1)
Basophils Absolute: 0.1 K/uL (ref 0.0–0.1)
Basophils Relative: 0.8 % (ref 0.0–3.0)
Basophils Relative: 1 %
Eosinophils Absolute: 0.4 K/uL (ref 0.0–0.7)
Eosinophils Absolute: 0.2 K/uL (ref 0.0–0.5)
Eosinophils Relative: 3.7 % (ref 0.0–5.0)
Eosinophils Relative: 3 %
HCT: 38.7 % (ref 36.0–46.0)
HCT: 40.6 % (ref 36.0–46.0)
Hemoglobin: 12.5 g/dL (ref 12.0–15.0)
Hemoglobin: 12.8 g/dL (ref 12.0–15.0)
Immature Granulocytes: 1 %
Lymphocytes Relative: 25.8 % (ref 12.0–46.0)
Lymphocytes Relative: 25 %
Lymphs Abs: 2.6 K/uL (ref 0.7–4.0)
Lymphs Abs: 2.1 K/uL (ref 0.7–4.0)
MCH: 27.3 pg (ref 26.0–34.0)
MCHC: 32.2 g/dL (ref 30.0–36.0)
MCHC: 31.5 g/dL (ref 30.0–36.0)
MCV: 83.5 fl (ref 78.0–100.0)
MCV: 86.6 fL (ref 80.0–100.0)
Monocytes Absolute: 1.2 K/uL — ABNORMAL HIGH (ref 0.1–1.0)
Monocytes Absolute: 0.8 K/uL (ref 0.1–1.0)
Monocytes Relative: 11.6 % (ref 3.0–12.0)
Monocytes Relative: 9 %
Neutro Abs: 5.9 K/uL (ref 1.4–7.7)
Neutro Abs: 5.2 K/uL (ref 1.7–7.7)
Neutrophils Relative %: 58.1 % (ref 43.0–77.0)
Neutrophils Relative %: 61 %
Platelets: 336 K/uL (ref 150.0–400.0)
Platelets: 334 K/uL (ref 150–400)
RBC: 4.64 Mil/uL (ref 3.87–5.11)
RBC: 4.69 MIL/uL (ref 3.87–5.11)
RDW: 15.6 % — ABNORMAL HIGH (ref 11.5–15.5)
RDW: 14.5 % (ref 11.5–15.5)
WBC: 10.1 K/uL (ref 4.0–10.5)
WBC: 8.4 K/uL (ref 4.0–10.5)
nRBC: 0 % (ref 0.0–0.2)

## 2024-06-10 LAB — URINALYSIS, ROUTINE W REFLEX MICROSCOPIC
Bacteria, UA: NONE SEEN
Bilirubin Urine: NEGATIVE
Glucose, UA: NEGATIVE mg/dL
Hgb urine dipstick: NEGATIVE
Ketones, ur: 5 mg/dL — AB
Nitrite: NEGATIVE
Protein, ur: NEGATIVE mg/dL
Specific Gravity, Urine: 1.023 (ref 1.005–1.030)
pH: 5 (ref 5.0–8.0)

## 2024-06-10 LAB — COMPREHENSIVE METABOLIC PANEL WITH GFR
ALT: 24 U/L (ref 0–44)
AST: 27 U/L (ref 15–41)
Albumin: 4.2 g/dL (ref 3.5–5.0)
Alkaline Phosphatase: 85 U/L (ref 38–126)
Anion gap: 12 (ref 5–15)
BUN: 9 mg/dL (ref 6–20)
CO2: 25 mmol/L (ref 22–32)
Calcium: 10 mg/dL (ref 8.9–10.3)
Chloride: 102 mmol/L (ref 98–111)
Creatinine, Ser: 0.69 mg/dL (ref 0.44–1.00)
GFR, Estimated: >60 mL/min (ref >60)
Glucose, Bld: 165 mg/dL — ABNORMAL HIGH (ref 70–99)
Potassium: 3.7 mmol/L (ref 3.5–5.1)
Sodium: 139 mmol/L (ref 135–145)
Total Bilirubin: 0.5 mg/dL (ref 0.0–1.2)
Total Protein: 7.9 g/dL (ref 6.5–8.1)

## 2024-06-10 LAB — LIPASE, BLOOD: Lipase: 31 U/L (ref 11–51)

## 2024-06-10 LAB — BASIC METABOLIC PANEL WITH GFR
BUN: 9 mg/dL (ref 6–23)
CO2: 23 meq/L (ref 19–32)
Calcium: 9.8 mg/dL (ref 8.4–10.5)
Chloride: 102 meq/L (ref 96–112)
Creatinine, Ser: 0.69 mg/dL (ref 0.40–1.20)
GFR: 96.84 mL/min (ref >60.00)
Glucose, Bld: 100 mg/dL — ABNORMAL HIGH (ref 70–99)
Potassium: 3.9 meq/L (ref 3.5–5.1)
Sodium: 140 meq/L (ref 135–145)

## 2024-06-10 MED ORDER — PANTOPRAZOLE SODIUM 20 MG PO TBEC: 20.0000 mg | DELAYED_RELEASE_TABLET | Freq: Every day | ORAL | 0 refills | Status: DC

## 2024-06-10 MED ORDER — MORPHINE SULFATE (PF) 4 MG/ML IV SOLN
4.0000 mg | Freq: Once | INTRAVENOUS | Status: AC
  Administered 2024-06-10: 4 mg via INTRAVENOUS
  Filled 2024-06-10: qty 1

## 2024-06-10 MED ORDER — IOHEXOL 350 MG/ML SOLN
75.0000 mL | Freq: Once | INTRAVENOUS | Status: AC | PRN
  Administered 2024-06-10: 75 mL via INTRAVENOUS

## 2024-06-10 MED ORDER — AMOXICILLIN 500 MG PO CAPS: 500.0000 mg | ORAL_CAPSULE | Freq: Three times a day (TID) | ORAL | 0 refills | Status: DC

## 2024-06-10 MED ORDER — ONDANSETRON HCL 4 MG PO TABS: 4.0000 mg | ORAL_TABLET | Freq: Three times a day (TID) | ORAL | 0 refills | Status: DC | PRN

## 2024-06-10 MED ORDER — ONDANSETRON HCL 4 MG/2ML IJ SOLN
4.0000 mg | Freq: Once | INTRAMUSCULAR | Status: AC
  Administered 2024-06-10: 4 mg via INTRAVENOUS
  Filled 2024-06-10: qty 2

## 2024-06-10 MED ORDER — ACETAMINOPHEN 500 MG PO TABS: 500.0000 mg | ORAL_TABLET | Freq: Four times a day (QID) | ORAL | 0 refills | Status: DC | PRN

## 2024-06-10 NOTE — ED Provider Notes (Signed)
 [name] AT [facility] Provider Note   CSN: [id number] Arrival date & time: 06/10/24  1034     Patient presents with: Abdominal Pain   Gabriela Conley is a 58 y.o. female.   The history is provided by the patient and medical records. No language interpreter was used.  Abdominal Pain    58 year old female with history of diabetes, GERD, thyroid  disease, obesity, dyslipidemia, asthma, presenting with complaint abdominal pain.  Patient report the past 3 days she has had persistent pain to her lower abdomen.  Pain is sharp achy throbbing to her left lower abdomen radiates to her back, persistent with associated nausea and vomiting.  Last bowel movement was a small amount of stool yesterday.  She is able to pass flatus.  She does not endorse any fever or chills no dysuria.  She mention her doctor recently evaluated her and thought it could be due to diverticulitis.  He did prescribe Cipro  and Flagyl  antibiotic yesterday.  Patient states she cannot keep the medication down due to her persistent nausea and her pain has been relenting.  She last had a colonoscopy several years ago with some benign polyps.  She denies alcohol or tobacco use.  She denies any blood in her urine or blood in her stool.  Prior to Admission medications   Medication Sig Start Date End Date Taking? Authorizing Provider  albuterol  (VENTOLIN  HFA) 108 (90 Base) MCG/ACT inhaler Inhale 1-2 puffs into the lungs every 6 (six) hours as needed for wheezing or shortness of breath. 02/09/23   Loreda Myla SAUNDERS, NP  atorvastatin  (LIPITOR) 10 MG tablet Take 1 tablet (10 mg total) by mouth daily. 04/28/24   Johnny Garnette LABOR, MD  augmented betamethasone  dipropionate (DIPROLENE -AF) 0.05 % cream Apply topically 2 (two) times daily. 11/21/22   Johnny Garnette LABOR, MD  ciprofloxacin  (CIPRO ) 500 MG tablet Take 1 tablet (500 mg total) by mouth 2 (two) times daily for 10 days. 06/09/24 06/19/24  Johnny Garnette LABOR, MD  fluticasone   (FLONASE ) 50 MCG/ACT nasal spray Place 1 spray into both nostrils daily. 02/09/23   Mayer, Jodi R, NP  glipiZIDE  (GLUCOTROL ) 10 MG tablet Take 1 tablet (10 mg total) by mouth 2 (two) times daily before a meal. 04/28/24   Johnny Garnette LABOR, MD  levothyroxine  (SYNTHROID ) 175 MCG tablet Take 1 tablet (175 mcg total) by mouth daily before breakfast. 04/28/24   Johnny Garnette LABOR, MD  metFORMIN  (GLUCOPHAGE ) 1000 MG tablet TAKE 1 TABLET BY MOUTH EVERY DAY WITH BREAKFAST 05/17/24   Johnny Garnette LABOR, MD  metroNIDAZOLE  (FLAGYL ) 500 MG tablet Take 1 tablet (500 mg total) by mouth 3 (three) times daily. 06/09/24   Johnny Garnette LABOR, MD  omeprazole  (PRILOSEC) 40 MG capsule Take 1 capsule (40 mg total) by mouth daily. 04/28/24   Johnny Garnette LABOR, MD  pioglitazone  (ACTOS ) 45 MG tablet TAKE 1 TABLET BY MOUTH EVERY DAY IN THE MORNING 04/28/24   Johnny Garnette LABOR, MD  Semaglutide ,0.25 or 0.5MG /DOS, (OZEMPIC , 0.25 OR 0.5 MG/DOSE,) 2 MG/3ML SOPN Inject 0.5 mg into the skin once a week. Patient not taking: Reported on 06/09/2024 04/28/24   Johnny Garnette LABOR, MD  venlafaxine  XR (EFFEXOR -XR) 150 MG 24 hr capsule Take 1 capsule (150 mg total) by mouth daily with breakfast. 04/28/24   Johnny Garnette LABOR, MD    Allergies: Chocolate    Review of Systems  Gastrointestinal:  Positive for abdominal pain.  All other systems reviewed and are negative.   Updated  Vital Signs BP (!) 142/92   Pulse 85   Temp 98.2 F (36.8 C) (Oral)   Resp 17   Ht 5' 2 (1.575 m)   Wt 88.5 kg   LMP 12/04/2020 (Approximate)   SpO2 98%   BMI 35.67 kg/m   Physical Exam Vitals and nursing note reviewed.  Constitutional:      General: She is not in acute distress.    Appearance: She is well-developed. She is obese.     Comments: Appears uncomfortable  HENT:     Head: Atraumatic.  Eyes:     Conjunctiva/sclera: Conjunctivae normal.  Cardiovascular:     Rate and Rhythm: Normal rate and regular rhythm.  Pulmonary:     Effort: Pulmonary effort is normal.  Abdominal:      General: Bowel sounds are normal.     Palpations: Abdomen is soft.     Tenderness: There is generalized abdominal tenderness and tenderness in the left lower quadrant.  Musculoskeletal:     Cervical back: Neck supple.  Skin:    Findings: No rash.  Neurological:     Mental Status: She is alert.  Psychiatric:        Mood and Affect: Mood normal.     (all labs ordered are listed, but only abnormal results are displayed) Labs Reviewed  COMPREHENSIVE METABOLIC PANEL WITH GFR - Abnormal; Notable for the following components:      Result Value   Glucose, Bld 165 (*)    All other components within normal limits  URINALYSIS, ROUTINE W REFLEX MICROSCOPIC - Abnormal; Notable for the following components:   Ketones, ur 5 (*)    Leukocytes,Ua TRACE (*)    All other components within normal limits  CBC WITH DIFFERENTIAL/PLATELET  LIPASE, BLOOD    EKG: None  Radiology: CT ABDOMEN PELVIS W CONTRAST Result Date: 06/10/2024 CLINICAL DATA:  Left lower quadrant pain EXAM: CT ABDOMEN AND PELVIS WITH CONTRAST TECHNIQUE: Multidetector CT imaging of the abdomen and pelvis was performed using the standard protocol following bolus administration of intravenous contrast. RADIATION DOSE REDUCTION: This exam was performed according to the departmental dose-optimization program which includes automated exposure control, adjustment of the mA and/or kV according to patient size and/or use of iterative reconstruction technique. CONTRAST:  75mL OMNIPAQUE  IOHEXOL  350 MG/ML SOLN COMPARISON:  04/01/2019 FINDINGS: Lower chest: Large hiatal hernia.  No acute findings. Hepatobiliary: Diffuse low-density throughout the liver compatible with fatty infiltration. No focal abnormality. Gallbladder unremarkable. Pancreas: No focal abnormality or ductal dilatation. Spleen: No focal abnormality.  Normal size. Adrenals/Urinary Tract: No adrenal abnormality. No focal renal abnormality. No stones or hydronephrosis. Urinary bladder  is unremarkable. Stomach/Bowel: Stomach is within normal limits. Appendix appears normal. No evidence of bowel wall thickening, distention, or inflammatory changes. Vascular/Lymphatic: No evidence of aneurysm or adenopathy. Reproductive: Uterus and adnexa unremarkable.  No mass. Other: No free fluid or free air. Musculoskeletal: No acute bony abnormality. IMPRESSION: No acute findings in the abdomen or pelvis. Mild hepatic steatosis. Large hiatal hernia. Electronically Signed   By: Franky Crease M.D.   On: 06/10/2024 14:05     Procedures   Medications Ordered in the ED  morphine  (PF) 4 MG/ML injection 4 mg (4 mg Intravenous Given 06/10/24 1112)  ondansetron  (ZOFRAN ) injection 4 mg (4 mg Intravenous Given 06/10/24 1111)  iohexol  (OMNIPAQUE ) 350 MG/ML injection 75 mL (75 mLs Intravenous Contrast Given 06/10/24 1322)  Medical Decision Making Amount and/or Complexity of Data Reviewed Labs: ordered. Radiology: ordered.  Risk Prescription drug management.   BP (!) 142/92   Pulse 85   Temp 98.2 F (36.8 C) (Oral)   Resp 17   Ht 5' 2 (1.575 m)   Wt 88.5 kg   LMP 12/04/2020 (Approximate)   SpO2 98%   BMI 35.67 kg/m   32:86 AM  58 year old female with history of diabetes, GERD, thyroid  disease, obesity, dyslipidemia, asthma, presenting with complaint abdominal pain.  Patient report the past 3 days she has had persistent pain to her lower abdomen.  Pain is sharp achy throbbing to her left lower abdomen radiates to her back, persistent with associated nausea and vomiting.  Last bowel movement was a small amount of stool yesterday.  She is able to pass flatus.  She does not endorse any fever or chills no dysuria.  She mention her doctor recently evaluated her and thought it could be due to diverticulitis.  He did prescribe Cipro  and Flagyl  antibiotic yesterday.  Patient states she cannot keep the medication down due to her persistent nausea and her pain has been  relenting.  She last had a colonoscopy several years ago with some benign polyps.  She denies alcohol or tobacco use.  She denies any blood in her urine or blood in her stool.  On exam patient appears uncomfortable.  She has diffuse abdominal tenderness more significant to the left lower quadrant and some tenderness to her lower back on percussion.  Bowel sounds are present.  Vitals are notable for mildly elevated blood pressure 142/92.  Patient is afebrile no hypoxia. Symptoms concerning for diverticulitis, will obtain abdominal pelvis CT scan.  Will give antinausea medication and pain medication for symptom control.  -Labs ordered, independently viewed and interpreted by me.  Labs remarkable for reassuring lab values.  CBG is 165 without evidence of DKA urinalysis without signs of urinary tract infection -The patient was maintained on a cardiac monitor.  I personally viewed and interpreted the cardiac monitored which showed an underlying rhythm of: Normal sinus rhythm -Imaging independently viewed and interpreted by me and I agree with radiologist's interpretation.  Result remarkable for abdominal pelvis CT scan without any concerning finding -This patient presents to the ED for concern of abdominal pain, this involves an extensive number of treatment options, and is a complaint that carries with it a high risk of complications and morbidity.  The differential diagnosis includes appendicitis, cholecystitis, colitis, pancreatitis, UTI, pyelonephritis -Co morbidities that complicate the patient evaluation includes diabetes, GERD, thyroid  disease, obesity -Treatment includes morphine , Zofran  -Reevaluation of the patient after these medicines showed that the patient improved -PCP office notes or outside notes reviewed -Escalation to admission/observation considered: patients feels much better, is comfortable with discharge, and will follow up with PCP -Prescription medication considered, patient  comfortable with zofran , and tylenol  -Social Determinant of Health considered which includes tobacco use      Final diagnoses:  Generalized abdominal pain  Hiatal hernia    ED Discharge Orders          Ordered    amoxicillin  (AMOXIL ) 500 MG capsule  3 times daily,   Status:  Discontinued        06/10/24 1116    ondansetron  (ZOFRAN ) 4 MG tablet  Every 8 hours PRN        06/10/24 1548    pantoprazole  (PROTONIX ) 20 MG tablet  Daily,   Status:  Discontinued  06/10/24 1548    acetaminophen  (TYLENOL ) 500 MG tablet  Every 6 hours PRN        06/10/24 1548               Nivia Colon, PA-C 06/10/24 1550    Yolande Lamar BROCKS, MD 06/11/24 [phone]

## 2024-06-10 NOTE — ED Triage Notes (Signed)
 C/O lower abd pain and lower back pain. C/O poor appetite, n/v/lightheadedness. Denies CP and SHOB.

## 2024-06-10 NOTE — ED Notes (Signed)
 Pt given DC paperwork along with follow up information. Has a ride to pick her up due to pain meds administration.

## 2024-06-10 NOTE — ED Notes (Signed)
 Pt reports ease of abdominal pain after medication.

## 2024-06-10 NOTE — Discharge Instructions (Addendum)
 You have been evaluated for your symptoms.  Fortunately no concerning findings are noted today's exam.  You do have a large hiatal hernia which may contribute to your symptoms.  Please take medication as prescribed and follow-up with GI specialist for further care.

## 2024-06-10 NOTE — Telephone Encounter (Signed)
  FYI Only or Action Required?: Action required by provider: update on patient condition.  Patient was last seen in primary care on 06/09/2024 by Gabriela Conley LABOR, MD.  Called Nurse Triage reporting Vomiting.  Symptoms began yesterday.  Interventions attempted: Nothing.  Symptoms are: gradually worsening. Pt. Seen in office yesterday. Vomiting is worse.I'm throwing everything up. I can't keep the antibiotics down and my stomach pain is worse. Pain 10/10. Pt. Crying. States she is still voiding. Weak.  Triage Disposition: Go to ED Now (or PCP Triage)  Patient/caregiver understands and will follow disposition?: Yes  Copied from CRM #[id number]. Topic: Clinical - Red Word Triage >> Jun 10, 2024  9:34 AM Harlene ORN wrote: Red Word that prompted transfer to Nurse Triage: constant vomiting and nausea gettign worse overnight Reason for Disposition  [1] SEVERE vomiting (e.g., 6 or more times/day) AND [2] present > 8 hours (Exception: Patient sounds well, is drinking liquids, does not sound dehydrated, and vomiting has lasted less than 24 hours.)  Answer Assessment - Initial Assessment Questions 1. VOMITING SEVERITY: How many times have you vomited in the past 24 hours?      numerous 2. ONSET: When did the vomiting begin?      This week 3. FLUIDS: What fluids or food have you vomited up today? Have you been able to keep any fluids down?     everything 4. ABDOMEN PAIN: Are your having any abdomen pain? If Yes : How bad is it and what does it feel like? (e.g., crampy, dull, intermittent, constant)      10/10 5. DIARRHEA: Is there any diarrhea? If Yes, ask: How many times today?      no 6. CONTACTS: Is there anyone else in the family with the same symptoms?      no 7. CAUSE: What do you think is causing your vomiting?     N/a 8. HYDRATION STATUS: Any signs of dehydration? (e.g., dry mouth [not only dry lips], too weak to stand) When did you last urinate?     normal 9.  OTHER SYMPTOMS: Do you have any other symptoms? (e.g., fever, headache, vertigo, vomiting blood or coffee grounds, recent head injury)     no 10. PREGNANCY: Is there any chance you are pregnant? When was your last menstrual period?       no  Protocols used: Vomiting-A-AH

## 2024-06-11 ENCOUNTER — Encounter: Payer: Self-pay | Admitting: Gastroenterology

## 2024-06-11 ENCOUNTER — Other Ambulatory Visit

## 2024-06-11 ENCOUNTER — Ambulatory Visit: Payer: Self-pay | Admitting: Family Medicine

## 2024-06-12 ENCOUNTER — Other Ambulatory Visit: Payer: Self-pay | Admitting: Family Medicine

## 2024-06-17 ENCOUNTER — Encounter: Payer: Self-pay | Admitting: Family Medicine

## 2024-06-17 DIAGNOSIS — K449 Diaphragmatic hernia without obstruction or gangrene: Secondary | ICD-10-CM

## 2024-06-18 MED ORDER — TRAMADOL HCL 50 MG PO TABS: 100.0000 mg | ORAL_TABLET | Freq: Four times a day (QID) | ORAL | 0 refills | Status: DC | PRN

## 2024-06-18 NOTE — Telephone Encounter (Signed)
 I read the ED report. I sent in some Tramadol  that she can use for pain. I have also referred her to Surgery to see if any procedures are needed

## 2024-07-01 ENCOUNTER — Encounter: Payer: Self-pay | Admitting: Family Medicine

## 2024-07-01 DIAGNOSIS — J45909 Unspecified asthma, uncomplicated: Secondary | ICD-10-CM

## 2024-07-02 MED ORDER — ALBUTEROL SULFATE HFA 108 (90 BASE) MCG/ACT IN AERS: 1.0000 | INHALATION_SPRAY | Freq: Four times a day (QID) | RESPIRATORY_TRACT | 0 refills | Status: AC | PRN

## 2024-07-06 NOTE — Telephone Encounter (Signed)
 I gave her 6 months of Omeprazole  on 04-08-24, so she can take that

## 2024-07-07 NOTE — Telephone Encounter (Signed)
 My Chart message sent

## 2024-07-15 ENCOUNTER — Other Ambulatory Visit: Payer: Self-pay | Admitting: Family Medicine

## 2024-07-28 ENCOUNTER — Ambulatory Visit: Admitting: Gastroenterology

## 2024-07-28 NOTE — Progress Notes (Deleted)
 Chief Complaint: ED follow-up, HH Primary GI Doctor: (previously Dr. Eda)  HPI:  Patient is a  58  year old female patient with past medical history of diabetes, GERD, thyroid  disease, obesity, dyslipidemia, asthma, who was referred to me by Dr. Elspeth Judah Schultze, MD on 06/29/24 for a evaluation of abdominal pain, HH .   Last seen by Dr. Eda in GI office on 09/05/20 for upper abdominal pain and change in bowel habits.  06/10/24 ED visit for abd pain. She mention her doctor recently evaluated her and thought it could be due to diverticulitis. He did prescribe Cipro  and Flagyl  antibiotic yesterday. Patient states she cannot keep the medication down due to her persistent nausea and her pain has been relenting. Labs remarkable for reassuring lab values.  CBG is 165 without evidence of DKA urinalysis without signs of urinary tract infection. CTAP- No acute findings in the abdomen or pelvis.Mild hepatic steatosis. Large hiatal hernia.  06/29/24 seen by [facility] for chronic abdominal pain and HH findings on CTAP. Dr. Schultze would like her to see GI for evaluation. Guarded in recommending surgery at this point. Mentioned possible Sibo treatment or different antiacid regimen. EGD Syna Gad be warranted. If gastroenterology workup is otherwise underwhelming does not have symptom improvement with medication adjustment; then, can consider workup with esophageal manometry and robotic surgery.   Interval History  Patient had episode of left-sided abdominal pain. Discussed with primary care wondered if was diverticulitis and placed on antibiotics. Felt worse. Went to ER. No evidence of diverticulitis with large hiatal hernia noted. Patient had some nausea with this. Recommend follow-up with primary care physician.   Last notes in 2021 talk about upper abdominal pain with nausea bloating and early satiety. Wondered if she had functional dyspepsia or SIBO. Apparently had GI symptoms on a trip to [place] in 2019 and was  told she had a parasite infection on antibiotics but she does not know what parasite nor what antibiotics. She did have C. difficile and O&P studies done in 2020 that were all negative. Dr. Eda did upper and lower endoscopy which confirmed a large hiatal hernia in 2021. A few polyps noted. 7-year follow-up recommended = 2028.   Patient admits/denies GERD Patient admits/denies dysphagia Patient admits/denies nausea, vomiting, or weight loss  Patient admits/denies altered bowel habits Patient admits/denies abdominal pain Patient admits/denies rectal bleeding   Denies/Admits alcohol Denies/Admits smoking Denies/Admits NSAID use. Denies/Admits they are on blood thinners.  Surgical history:  Father with stomach cancer at age 41. Mother had uterine cancer at age 61. No known family history of colon cancer or polyps. No other family history of uterine/endometrial cancer, pancreatic cancer or gastric/stomach cancer.   Endoscopic history Colonoscopy in [place]  8 years ago EGD 07/20/2020: large hiatal hernia, H. pylori negative gastritis Colonoscopy 07/20/20: 2 small tubular adenomas, a small hyperplastic polyp, and nonbleeding internal hemorrhoids.  Right and left sided colon biopsies were negative for microscopic colitis.  Wt Readings from Last 3 Encounters:  06/10/24 195 lb (88.5 kg)  06/09/24 195 lb (88.5 kg)  05/11/24 191 lb 12.8 oz (87 kg)      Past Medical History:  Diagnosis Date   Allergy    Anxiety    Asthma    Diabetes mellitus without complication (HCC)    gestational    GERD (gastroesophageal reflux disease)    Hypothyroid     Past Surgical History:  Procedure Laterality Date   BREAST SURGERY     removal of benign cyst  COLONOSCOPY  07/20/2020   per Dr. Eda, benign polyps, repeat in 7 yrs    CYSTECTOMY     removed from chest   ESOPHAGOGASTRODUODENOSCOPY  07/20/2020   per Dr. Eda, large hiatal hernia and gastritis    HERNIA REPAIR     umbilical     TUBAL LIGATION      Current Outpatient Medications  Medication Sig Dispense Refill   acetaminophen  (TYLENOL ) 500 MG tablet Take 1 tablet (500 mg total) by mouth every 6 (six) hours as needed. 30 tablet 0   albuterol  (VENTOLIN  HFA) 108 (90 Base) MCG/ACT inhaler Inhale 1-2 puffs into the lungs every 6 (six) hours as needed for wheezing or shortness of breath. 1 each 0   atorvastatin  (LIPITOR) 10 MG tablet Take 1 tablet (10 mg total) by mouth daily. 90 tablet 1   augmented betamethasone  dipropionate (DIPROLENE -AF) 0.05 % cream Apply topically 2 (two) times daily. 50 g 5   fluticasone  (FLONASE ) 50 MCG/ACT nasal spray Place 1 spray into both nostrils daily. 15.8 mL 0   glipiZIDE  (GLUCOTROL ) 10 MG tablet TAKE 1 TABLET (10 MG TOTAL) BY MOUTH TWICE A DAY BEFORE A MEAL 180 tablet 3   levothyroxine  (SYNTHROID ) 175 MCG tablet Take 1 tablet (175 mcg total) by mouth daily before breakfast. 90 tablet 1   metFORMIN  (GLUCOPHAGE ) 1000 MG tablet TAKE 1 TABLET(1000 MG TOTAL) BY MOUTH DAILY WITH BREAKFAST 180 tablet 3   metroNIDAZOLE  (FLAGYL ) 500 MG tablet Take 1 tablet (500 mg total) by mouth 3 (three) times daily. 30 tablet 0   omeprazole  (PRILOSEC) 40 MG capsule Take 1 capsule (40 mg total) by mouth daily. 90 capsule 1   ondansetron  (ZOFRAN ) 4 MG tablet Take 1 tablet (4 mg total) by mouth every 8 (eight) hours as needed. 12 tablet 0   pioglitazone  (ACTOS ) 45 MG tablet TAKE 1 TABLET BY MOUTH EVERY DAY IN THE MORNING 90 tablet 1   traMADol  (ULTRAM ) 50 MG tablet Take 2 tablets (100 mg total) by mouth every 6 (six) hours as needed for severe pain (pain score 7-10). 60 tablet 0   venlafaxine  XR (EFFEXOR -XR) 150 MG 24 hr capsule Take 1 capsule (150 mg total) by mouth daily with breakfast. 90 capsule 1   No current facility-administered medications for this visit.    Allergies as of 07/28/2024 - Review Complete 06/10/2024  Allergen Reaction Noted   Chocolate Anaphylaxis 05/03/2013    Family History  Problem  Relation Age of Onset   Uterine cancer Mother    Emphysema Mother    Pancreatic cancer Father    Heart disease Brother    Diabetes Maternal Uncle    Colon cancer Neg Hx    Esophageal cancer Neg Hx     Review of Systems:    Constitutional: No weight loss, fever, chills, weakness or fatigue HEENT: Eyes: No change in vision               Ears, Nose, Throat:  No change in hearing or congestion Skin: No rash or itching Cardiovascular: No chest pain, chest pressure or palpitations   Respiratory: No SOB or cough Gastrointestinal: See HPI and otherwise negative Genitourinary: No dysuria or change in urinary frequency Neurological: No headache, dizziness or syncope Musculoskeletal: No new muscle or joint pain Hematologic: No bleeding or bruising Psychiatric: No history of depression or anxiety    Physical Exam:  Vital signs: LMP 12/04/2020 (Approximate)   Constitutional:   Pleasant *** female/female appears to be in NAD, Well developed,  Well nourished, alert and cooperative Eyes:   PEERL, EOMI. No icterus. Conjunctiva pink. Neck:  Supple Throat: Oral cavity and pharynx without inflammation, swelling or lesion.  Respiratory: Respirations even and unlabored. Lungs clear to auscultation bilaterally.   No wheezes, crackles, or rhonchi.  Cardiovascular: Normal S1, S2. Regular rate and rhythm. No peripheral edema, cyanosis or pallor.  Gastrointestinal:  Soft, nondistended, nontender. No rebound or guarding. Normal bowel sounds. No appreciable masses or hepatomegaly. Rectal:  Not performed.  Anoscopy: Msk:  Symmetrical without gross deformities. Without edema, no deformity or joint abnormality.  Neurologic:  Alert and  oriented x4;  grossly normal neurologically.  Skin:   Dry and intact without significant lesions or rashes.  RELEVANT LABS AND IMAGING: CBC    Latest Ref Rng & Units 06/10/2024   11:11 AM 06/09/2024    3:49 PM 12/08/2023    8:09 AM  CBC  WBC 4.0 - 10.5 K/uL 8.4  10.1  6.3    Hemoglobin 12.0 - 15.0 g/dL 87.1  87.4  88.0   Hematocrit 36.0 - 46.0 % 40.6  38.7  36.4   Platelets 150 - 400 K/uL 334  336.0  253.0      CMP     Latest Ref Rng & Units 06/10/2024   11:11 AM 06/09/2024    3:49 PM 12/08/2023    8:09 AM  CMP  Glucose 70 - 99 mg/dL 834  899  840   BUN 6 - 20 mg/dL 9  9  15    Creatinine 0.44 - 1.00 mg/dL 9.30  9.30  9.23   Sodium 135 - 145 mmol/L 139  140  140   Potassium 3.5 - 5.1 mmol/L 3.7  3.9  4.0   Chloride 98 - 111 mmol/L 102  102  103   CO2 22 - 32 mmol/L 25  23  26    Calcium  8.9 - 10.3 mg/dL 89.9  9.8  9.5   Total Protein 6.5 - 8.1 g/dL 7.9   7.3   Total Bilirubin 0.0 - 1.2 mg/dL 0.5   0.3   Alkaline Phos 38 - 126 U/L 85   76   AST 15 - 41 U/L 27   16   ALT 0 - 44 U/L 24   21      Lab Results  Component Value Date   TSH 0.23 (L) 12/08/2023  06/10/24 CTAP  No acute findings in the abdomen or pelvis.   Mild hepatic steatosis.   Large hiatal hernia.  Assessment: 1. ***  Plan: 1. ***   Thank you for the courtesy of this consult. Please call me with any questions or concerns.   Ichael Pullara, FNP-C [facility] 07/28/2024, 7:26 AM  Cc: Johnny Garnette LABOR, MD

## 2024-09-21 ENCOUNTER — Ambulatory Visit: Admitting: Gastroenterology

## 2024-09-21 NOTE — Progress Notes (Deleted)
 Chief Complaint:ED follow-up, abdominal pain Primary GI Doctor:(previously Dr. Eda)  HPI:  Patient is a  58  year old female patient with past medical history of diabetes, GERD, thyroid  disease, obesity, dyslipidemia, asthma, who presents for a evaluation of *** .    06/10/24 patient seen in ED for abdominal pain. Labs: normal CBC, normal CMET, normal lipase. CTAP with no acute findings. Mild hepatic steatosis. Large HH.  Interval History  Patient last seen in GI office by Dr. Eda in November 2021 for upper abdominal pain and change in bowel habits.  Patient admits/denies GERD Patient admits/denies dysphagia Patient admits/denies nausea, vomiting, or weight loss  Patient admits/denies altered bowel habits Patient admits/denies abdominal pain Patient admits/denies rectal bleeding   Denies/Admits alcohol Denies/Admits smoking Denies/Admits NSAID use. Denies/Admits they are on blood thinners.  Patients last colonoscopy Patients last EGD  Surgical history:  Patient's family history includes: Father with stomach cancer at age 8.Mother had uterine cancer at age 97. No known family history of colon cancer or polyps. No other family history of uterine/endometrial cancer, pancreatic cancer or gastric/stomach cancer.   Wt Readings from Last 3 Encounters:  06/10/24 195 lb (88.5 kg)  06/09/24 195 lb (88.5 kg)  05/11/24 191 lb 12.8 oz (87 kg)      Past Medical History:  Diagnosis Date   Allergy    Anxiety    Asthma    Diabetes mellitus without complication (HCC)    gestational    GERD (gastroesophageal reflux disease)    Hypothyroid     Past Surgical History:  Procedure Laterality Date   BREAST SURGERY     removal of benign cyst    COLONOSCOPY  07/20/2020   per Dr. Eda, benign polyps, repeat in 7 yrs    CYSTECTOMY     removed from chest   ESOPHAGOGASTRODUODENOSCOPY  07/20/2020   per Dr. Eda, large hiatal hernia and gastritis    HERNIA REPAIR      umbilical    TUBAL LIGATION      Current Outpatient Medications  Medication Sig Dispense Refill   acetaminophen  (TYLENOL ) 500 MG tablet Take 1 tablet (500 mg total) by mouth every 6 (six) hours as needed. 30 tablet 0   albuterol  (VENTOLIN  HFA) 108 (90 Base) MCG/ACT inhaler Inhale 1-2 puffs into the lungs every 6 (six) hours as needed for wheezing or shortness of breath. 1 each 0   atorvastatin  (LIPITOR) 10 MG tablet Take 1 tablet (10 mg total) by mouth daily. 90 tablet 1   augmented betamethasone  dipropionate (DIPROLENE -AF) 0.05 % cream Apply topically 2 (two) times daily. 50 g 5   fluticasone  (FLONASE ) 50 MCG/ACT nasal spray Place 1 spray into both nostrils daily. 15.8 mL 0   glipiZIDE  (GLUCOTROL ) 10 MG tablet TAKE 1 TABLET (10 MG TOTAL) BY MOUTH TWICE A DAY BEFORE A MEAL 180 tablet 3   levothyroxine  (SYNTHROID ) 175 MCG tablet Take 1 tablet (175 mcg total) by mouth daily before breakfast. 90 tablet 1   metFORMIN  (GLUCOPHAGE ) 1000 MG tablet TAKE 1 TABLET(1000 MG TOTAL) BY MOUTH DAILY WITH BREAKFAST 180 tablet 3   metroNIDAZOLE  (FLAGYL ) 500 MG tablet Take 1 tablet (500 mg total) by mouth 3 (three) times daily. 30 tablet 0   omeprazole  (PRILOSEC) 40 MG capsule Take 1 capsule (40 mg total) by mouth daily. 90 capsule 1   ondansetron  (ZOFRAN ) 4 MG tablet Take 1 tablet (4 mg total) by mouth every 8 (eight) hours as needed. 12 tablet 0   pioglitazone  (  ACTOS ) 45 MG tablet TAKE 1 TABLET BY MOUTH EVERY DAY IN THE MORNING 90 tablet 1   traMADol  (ULTRAM ) 50 MG tablet Take 2 tablets (100 mg total) by mouth every 6 (six) hours as needed for severe pain (pain score 7-10). 60 tablet 0   venlafaxine  XR (EFFEXOR -XR) 150 MG 24 hr capsule Take 1 capsule (150 mg total) by mouth daily with breakfast. 90 capsule 1   No current facility-administered medications for this visit.    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    Family History   Problem Relation Age of Onset   Uterine cancer Mother    Emphysema Mother    Pancreatic cancer Father    Heart disease Brother    Diabetes Maternal Uncle    Colon cancer Neg Hx    Esophageal cancer Neg Hx     Review of Systems:    Constitutional: No weight loss, fever, chills, weakness or fatigue HEENT: Eyes: No change in vision               Ears, Nose, Throat:  No change in hearing or congestion Skin: No rash or itching Cardiovascular: No chest pain, chest pressure or palpitations   Respiratory: No SOB or cough Gastrointestinal: See HPI and otherwise negative Genitourinary: No dysuria or change in urinary frequency Neurological: No headache, dizziness or syncope Musculoskeletal: No new muscle or joint pain Hematologic: No bleeding or bruising Psychiatric: No history of depression or anxiety    Physical Exam:  Vital signs: LMP 12/04/2020 (Approximate)   Constitutional:   Pleasant *** female/female appears to be in NAD, Well developed, Well nourished, alert and cooperative Eyes:   PEERL, EOMI. No icterus. Conjunctiva pink. Neck:  Supple Throat: Oral cavity and pharynx without inflammation, swelling or lesion.  Respiratory: Respirations even and unlabored. Lungs clear to auscultation bilaterally.   No wheezes, crackles, or rhonchi.  Cardiovascular: Normal S1, S2. Regular rate and rhythm. No peripheral edema, cyanosis or pallor.  Gastrointestinal:  Soft, nondistended, nontender. No rebound or guarding. Normal bowel sounds. No appreciable masses or hepatomegaly. Rectal:  Not performed.  Anoscopy: Msk:  Symmetrical without gross deformities. Without edema, no deformity or joint abnormality.  Neurologic:  Alert and  oriented x4;  grossly normal neurologically.  Skin:   Dry and intact without significant lesions or rashes.  RELEVANT LABS AND IMAGING: CBC    Latest Ref Rng & Units 06/10/2024   11:11 AM 06/09/2024    3:49 PM 12/08/2023    8:09 AM  CBC  WBC 4.0 - 10.5 K/uL 8.4   10.1  6.3   Hemoglobin 12.0 - 15.0 g/dL 87.1  87.4  88.0   Hematocrit 36.0 - 46.0 % 40.6  38.7  36.4   Platelets 150 - 400 K/uL 334  336.0  253.0      CMP     Latest Ref Rng & Units 06/10/2024   11:11 AM 06/09/2024    3:49 PM 12/08/2023    8:09 AM  CMP  Glucose 70 - 99 mg/dL 834  899  840   BUN 6 - 20 mg/dL 9  9  15    Creatinine 0.44 - 1.00 mg/dL 9.30  9.30  9.23   Sodium 135 - 145 mmol/L 139  140  140   Potassium 3.5 - 5.1 mmol/L 3.7  3.9  4.0   Chloride 98 - 111 mmol/L 102  102  103   CO2 22 -  32 mmol/L 25  23  26    Calcium  8.9 - 10.3 mg/dL 89.9  9.8  9.5   Total Protein 6.5 - 8.1 g/dL 7.9   7.3   Total Bilirubin 0.0 - 1.2 mg/dL 0.5   0.3   Alkaline Phos 38 - 126 U/L 85   76   AST 15 - 41 U/L 27   16   ALT 0 - 44 U/L 24   21      Lab Results  Component Value Date   TSH 0.23 (L) 12/08/2023   Abdominal imaging: Abdominal ultrasound 05/03/19: echogenic liver, otherwise normal CT abd/pelvis with contrast 04/01/19: no acute findings, large hiatal hernia, hepatic steatosis CT abd/pelvis without contrast 05/21/13: large hiatal hernia 06/10/24 CTAP: No acute findings in the abdomen or pelvis. Mild hepatic steatosis. Large hiatal hernia.  GI procedures:  07/20/2020 colonoscopy, recall 7 years  - One 2 mm polyp in the rectum, removed with a cold biopsy forceps. Resected and retrieved. - One 3 mm polyp in the distal descending colon, removed with a cold snare. Resected and retrieved. - One 1 mm polyp in the proximal descending colon, removed with a cold biopsy forceps. Resected and retrieved. - The entire examined colon is normal. Biopsied given her recent symptoms. - Non- bleeding internal hemorrhoids. - The examination was otherwise normal on direct and retroflexion views. Path: 3. Surgical [P], random colon bx - UNREMARKABLE COLONIC MUCOSA. - NO MICROSCOPIC COLITIS, ACTIVE INFLAMMATION OR CHRONIC CHANGES. 4. Surgical [P], colon, descending, polyp (2) - TUBULAR ADENOMA (FOUR FRAGMENTS). -  NO HIGH GRADE DYSPLASIA OR CARCINOMA. 5. Surgical [P], colon, rectum, polyp - HYPERPLASTIC POLYP. - NO ADENOMATOUS CHANGE OR CARCINOMA  Colonoscopy (2018) in [place] at [facility] for the evaluation of loose stools. Her intestines were swollen but they didn't tell her anything else.   07/20/2020 EGD - Normal esophagus. - Large hiatal hernia. - Normal stomach. Biopsied. - Normal examined duodenum. Biopsied. - The examination was otherwise normal. Path: 1. Surgical [P], duodenal bx - UNREMARKABLE DUODENAL MUCOSA. - NO FEATURES OF CELIAC SPRUE OR GRANULOMAS. 2. Surgical [P], fundus, gastric antrum and gastric body - ANTRAL AND OXYNTIC MUCOSA WITH SLIGHT CHRONIC INFLAMMATION. [place] PHLEGM NEGATIVE FOR HELICOBACTER PYLORI. - NO INTESTINAL METAPLASIA, DYSPLASIA, OR CARCINOMA   Assessment: GERD with large HH Mild hepatic steatosis, normal LFTs  Plan: 1. ***   Thank you for the courtesy of this consult. Please call me with any questions or concerns.   Flecia Shutter, FNP-C [facility] 09/21/2024, 7:15 AM  Cc: Johnny Garnette LABOR, MD

## 2024-10-05 ENCOUNTER — Other Ambulatory Visit: Payer: Self-pay

## 2024-10-05 ENCOUNTER — Observation Stay (HOSPITAL_COMMUNITY)
Admission: EM | Admit: 2024-10-05 | Discharge: 2024-10-07 | DRG: 603 | Disposition: A | Attending: Family Medicine | Admitting: Family Medicine

## 2024-10-05 ENCOUNTER — Encounter (HOSPITAL_COMMUNITY): Payer: Self-pay

## 2024-10-05 ENCOUNTER — Emergency Department (HOSPITAL_COMMUNITY)

## 2024-10-05 DIAGNOSIS — K029 Dental caries, unspecified: Secondary | ICD-10-CM | POA: Diagnosis not present

## 2024-10-05 DIAGNOSIS — L0291 Cutaneous abscess, unspecified: Secondary | ICD-10-CM

## 2024-10-05 DIAGNOSIS — R220 Localized swelling, mass and lump, head: Secondary | ICD-10-CM | POA: Diagnosis not present

## 2024-10-05 DIAGNOSIS — R590 Localized enlarged lymph nodes: Secondary | ICD-10-CM | POA: Diagnosis not present

## 2024-10-05 DIAGNOSIS — E785 Hyperlipidemia, unspecified: Secondary | ICD-10-CM | POA: Diagnosis present

## 2024-10-05 DIAGNOSIS — L03211 Cellulitis of face: Secondary | ICD-10-CM | POA: Diagnosis not present

## 2024-10-05 DIAGNOSIS — L309 Dermatitis, unspecified: Secondary | ICD-10-CM

## 2024-10-05 DIAGNOSIS — L409 Psoriasis, unspecified: Secondary | ICD-10-CM

## 2024-10-05 DIAGNOSIS — L03811 Cellulitis of head [any part, except face]: Principal | ICD-10-CM

## 2024-10-05 DIAGNOSIS — F419 Anxiety disorder, unspecified: Secondary | ICD-10-CM | POA: Diagnosis present

## 2024-10-05 DIAGNOSIS — E1169 Type 2 diabetes mellitus with other specified complication: Secondary | ICD-10-CM | POA: Diagnosis present

## 2024-10-05 DIAGNOSIS — E039 Hypothyroidism, unspecified: Secondary | ICD-10-CM | POA: Diagnosis present

## 2024-10-05 LAB — CBC WITH DIFFERENTIAL/PLATELET
Abs Immature Granulocytes: 0.05 K/uL (ref 0.00–0.07)
Basophils Absolute: 0.1 K/uL (ref 0.0–0.1)
Basophils Relative: 1 %
Eosinophils Absolute: 0.5 K/uL (ref 0.0–0.5)
Eosinophils Relative: 4 %
HCT: 40.3 % (ref 36.0–46.0)
Hemoglobin: 12.6 g/dL (ref 12.0–15.0)
Immature Granulocytes: 0 %
Lymphocytes Relative: 18 %
Lymphs Abs: 2 K/uL (ref 0.7–4.0)
MCH: 26.3 pg (ref 26.0–34.0)
MCHC: 31.3 g/dL (ref 30.0–36.0)
MCV: 84 fL (ref 80.0–100.0)
Monocytes Absolute: 1.1 K/uL — ABNORMAL HIGH (ref 0.1–1.0)
Monocytes Relative: 10 %
Neutro Abs: 7.7 K/uL (ref 1.7–7.7)
Neutrophils Relative %: 67 %
Platelets: 236 K/uL (ref 150–400)
RBC: 4.8 MIL/uL (ref 3.87–5.11)
RDW: 15.4 % (ref 11.5–15.5)
WBC: 11.3 K/uL — ABNORMAL HIGH (ref 4.0–10.5)
nRBC: 0 % (ref 0.0–0.2)

## 2024-10-05 LAB — COMPREHENSIVE METABOLIC PANEL WITH GFR
ALT: 20 U/L (ref 0–44)
AST: 19 U/L (ref 15–41)
Albumin: 3.9 g/dL (ref 3.5–5.0)
Alkaline Phosphatase: 101 U/L (ref 38–126)
Anion gap: 7 (ref 5–15)
BUN: 6 mg/dL (ref 6–20)
CO2: 26 mmol/L (ref 22–32)
Calcium: 9.2 mg/dL (ref 8.9–10.3)
Chloride: 103 mmol/L (ref 98–111)
Creatinine, Ser: 0.74 mg/dL (ref 0.44–1.00)
GFR, Estimated: >60 mL/min (ref >60)
Glucose, Bld: 243 mg/dL — ABNORMAL HIGH (ref 70–99)
Potassium: 3.6 mmol/L (ref 3.5–5.1)
Sodium: 136 mmol/L (ref 135–145)
Total Bilirubin: 0.3 mg/dL (ref 0.0–1.2)
Total Protein: 7.5 g/dL (ref 6.5–8.1)

## 2024-10-05 LAB — CBG MONITORING, ED: Glucose-Capillary: 128 mg/dL — ABNORMAL HIGH (ref 70–99)

## 2024-10-05 LAB — I-STAT CG4 LACTIC ACID, ED: Lactic Acid, Venous: 2.3 mmol/L (ref 0.5–1.9)

## 2024-10-05 LAB — PHOSPHORUS: Phosphorus: 3.3 mg/dL (ref 2.5–4.6)

## 2024-10-05 LAB — MAGNESIUM: Magnesium: 1.9 mg/dL (ref 1.7–2.4)

## 2024-10-05 LAB — HIV ANTIBODY (ROUTINE TESTING W REFLEX): HIV Screen 4th Generation wRfx: NONREACTIVE

## 2024-10-05 MED ORDER — LEVOTHYROXINE SODIUM 75 MCG PO TABS
175.0000 ug | ORAL_TABLET | Freq: Every day | ORAL | Status: DC
  Administered 2024-10-06 – 2024-10-07 (×2): 175 ug via ORAL
  Filled 2024-10-05: qty 1
  Filled 2024-10-05: qty 2
  Filled 2024-10-05: qty 1
  Filled 2024-10-05: qty 2
  Filled 2024-10-05 (×2): qty 1

## 2024-10-05 MED ORDER — HYDRALAZINE HCL 20 MG/ML IJ SOLN: 10.0000 mg | INTRAMUSCULAR | Status: DC | PRN

## 2024-10-05 MED ORDER — SODIUM CHLORIDE 0.9% FLUSH
3.0000 mL | Freq: Two times a day (BID) | INTRAVENOUS | Status: DC
  Administered 2024-10-05 – 2024-10-07 (×3): 3 mL via INTRAVENOUS

## 2024-10-05 MED ORDER — VANCOMYCIN HCL IN DEXTROSE 1-5 GM/200ML-% IV SOLN: 1000.0000 mg | Freq: Once | INTRAVENOUS | Status: DC

## 2024-10-05 MED ORDER — HEPARIN SODIUM (PORCINE) 5000 UNIT/ML IJ SOLN
5000.0000 [IU] | Freq: Three times a day (TID) | INTRAMUSCULAR | Status: DC
  Administered 2024-10-05 – 2024-10-07 (×5): 5000 [IU] via SUBCUTANEOUS
  Filled 2024-10-05 (×6): qty 1

## 2024-10-05 MED ORDER — SODIUM CHLORIDE 0.9 % IV SOLN: 2.0000 g | Freq: Once | INTRAVENOUS | Status: DC

## 2024-10-05 MED ORDER — ONDANSETRON HCL 4 MG/2ML IJ SOLN: 4.0000 mg | Freq: Four times a day (QID) | INTRAMUSCULAR | Status: DC | PRN

## 2024-10-05 MED ORDER — SODIUM CHLORIDE 0.9 % IV SOLN: INTRAVENOUS | Status: AC

## 2024-10-05 MED ORDER — SODIUM CHLORIDE 0.9 % IV BOLUS
1000.0000 mL | Freq: Once | INTRAVENOUS | Status: AC
  Administered 2024-10-05: 1000 mL via INTRAVENOUS

## 2024-10-05 MED ORDER — VENLAFAXINE HCL ER 75 MG PO CP24
150.0000 mg | ORAL_CAPSULE | Freq: Every day | ORAL | Status: DC
  Administered 2024-10-06 – 2024-10-07 (×2): 150 mg via ORAL
  Filled 2024-10-05 (×4): qty 2

## 2024-10-05 MED ORDER — FLEET ENEMA RE ENEM: 1.0000 | ENEMA | Freq: Once | RECTAL | Status: DC | PRN

## 2024-10-05 MED ORDER — ONDANSETRON HCL 4 MG PO TABS
4.0000 mg | ORAL_TABLET | Freq: Four times a day (QID) | ORAL | Status: DC | PRN
  Administered 2024-10-05 – 2024-10-06 (×2): 4 mg via ORAL
  Filled 2024-10-05 (×2): qty 1

## 2024-10-05 MED ORDER — VANCOMYCIN HCL 1250 MG/250ML IV SOLN
1250.0000 mg | INTRAVENOUS | Status: DC
  Filled 2024-10-05: qty 250

## 2024-10-05 MED ORDER — FLORANEX PO PACK
1.0000 g | PACK | Freq: Three times a day (TID) | ORAL | Status: DC
  Administered 2024-10-05 – 2024-10-07 (×6): 1 g via ORAL
  Filled 2024-10-05 (×8): qty 1

## 2024-10-05 MED ORDER — SENNOSIDES-DOCUSATE SODIUM 8.6-50 MG PO TABS: 1.0000 | ORAL_TABLET | Freq: Every evening | ORAL | Status: DC | PRN

## 2024-10-05 MED ORDER — IOHEXOL 350 MG/ML SOLN
75.0000 mL | Freq: Once | INTRAVENOUS | Status: AC | PRN
  Administered 2024-10-05: 75 mL via INTRAVENOUS

## 2024-10-05 MED ORDER — OXYCODONE HCL 5 MG PO TABS
5.0000 mg | ORAL_TABLET | ORAL | Status: DC | PRN
  Administered 2024-10-05 – 2024-10-07 (×6): 5 mg via ORAL
  Filled 2024-10-05 (×6): qty 1

## 2024-10-05 MED ORDER — HYDROMORPHONE HCL 1 MG/ML IJ SOLN
0.5000 mg | INTRAMUSCULAR | Status: DC | PRN
  Administered 2024-10-05: 1 mg via INTRAVENOUS
  Filled 2024-10-05: qty 1

## 2024-10-05 MED ORDER — IPRATROPIUM BROMIDE 0.02 % IN SOLN: 0.5000 mg | Freq: Four times a day (QID) | RESPIRATORY_TRACT | Status: DC | PRN

## 2024-10-05 MED ORDER — ACETAMINOPHEN 325 MG PO TABS: 650.0000 mg | ORAL_TABLET | Freq: Four times a day (QID) | ORAL | Status: DC | PRN

## 2024-10-05 MED ORDER — TETANUS-DIPHTH-ACELL PERTUSSIS 5-2-15.5 LF-MCG/0.5 IM SUSP
0.5000 mL | Freq: Once | INTRAMUSCULAR | Status: AC
  Administered 2024-10-05: 0.5 mL via INTRAMUSCULAR
  Filled 2024-10-05: qty 0.5

## 2024-10-05 MED ORDER — VANCOMYCIN HCL 1500 MG/300ML IV SOLN
1500.0000 mg | Freq: Once | INTRAVENOUS | Status: AC
  Administered 2024-10-05: 1500 mg via INTRAVENOUS
  Filled 2024-10-05: qty 300

## 2024-10-05 MED ORDER — SODIUM CHLORIDE 0.9 % IV SOLN: INTRAVENOUS | Status: DC

## 2024-10-05 MED ORDER — TRAZODONE HCL 50 MG PO TABS
25.0000 mg | ORAL_TABLET | Freq: Every evening | ORAL | Status: DC | PRN
  Administered 2024-10-05: 25 mg via ORAL
  Filled 2024-10-05: qty 1

## 2024-10-05 MED ORDER — PANTOPRAZOLE SODIUM 40 MG PO TBEC
40.0000 mg | DELAYED_RELEASE_TABLET | Freq: Every day | ORAL | Status: DC
  Administered 2024-10-05 – 2024-10-07 (×3): 40 mg via ORAL
  Filled 2024-10-05 (×3): qty 1

## 2024-10-05 MED ORDER — SODIUM CHLORIDE 0.9 % IV SOLN
2.0000 g | Freq: Once | INTRAVENOUS | Status: DC
  Administered 2024-10-05: 2 g via INTRAVENOUS
  Filled 2024-10-05: qty 20

## 2024-10-05 MED ORDER — SODIUM CHLORIDE 0.9 % IV SOLN
2.0000 g | Freq: Three times a day (TID) | INTRAVENOUS | Status: DC
  Administered 2024-10-05 – 2024-10-06 (×3): 2 g via INTRAVENOUS
  Filled 2024-10-05 (×3): qty 12.5

## 2024-10-05 MED ORDER — VANCOMYCIN HCL 1500 MG/300ML IV SOLN
1500.0000 mg | Freq: Once | INTRAVENOUS | Status: DC
  Filled 2024-10-05: qty 300

## 2024-10-05 MED ORDER — BISACODYL 5 MG PO TBEC: 5.0000 mg | DELAYED_RELEASE_TABLET | Freq: Every day | ORAL | Status: DC | PRN

## 2024-10-05 MED ORDER — ACETAMINOPHEN 650 MG RE SUPP: 650.0000 mg | Freq: Four times a day (QID) | RECTAL | Status: DC | PRN

## 2024-10-05 MED ORDER — INSULIN ASPART 100 UNIT/ML IJ SOLN
0.0000 [IU] | Freq: Three times a day (TID) | INTRAMUSCULAR | Status: DC
  Administered 2024-10-06: 2 [IU] via SUBCUTANEOUS
  Administered 2024-10-06: 1 [IU] via SUBCUTANEOUS
  Administered 2024-10-06: 2 [IU] via SUBCUTANEOUS
  Administered 2024-10-07: 3 [IU] via SUBCUTANEOUS
  Administered 2024-10-07: 2 [IU] via SUBCUTANEOUS
  Filled 2024-10-05: qty 3
  Filled 2024-10-05 (×2): qty 2
  Filled 2024-10-05: qty 3
  Filled 2024-10-05: qty 1

## 2024-10-05 MED ORDER — ATORVASTATIN CALCIUM 10 MG PO TABS
10.0000 mg | ORAL_TABLET | Freq: Every day | ORAL | Status: DC
  Administered 2024-10-05 – 2024-10-07 (×3): 10 mg via ORAL
  Filled 2024-10-05 (×3): qty 1

## 2024-10-05 MED ORDER — KETOROLAC TROMETHAMINE 15 MG/ML IJ SOLN
15.0000 mg | Freq: Once | INTRAMUSCULAR | Status: AC
  Administered 2024-10-05: 15 mg via INTRAVENOUS
  Filled 2024-10-05: qty 1

## 2024-10-05 NOTE — Assessment & Plan Note (Signed)
 Continue statins

## 2024-10-05 NOTE — Assessment & Plan Note (Signed)
 His anxiety, depression, continue home medication of Effexor 

## 2024-10-05 NOTE — H&P (Signed)
 History and Physical   Patient: Gabriela Conley                            PCP: Johnny Garnette LABOR, MD                    DOB: June 11, 1966            DOA: 10/05/2024 FMW:[id number]             DOS: 10/05/2024, 4:52 PM  Johnny Garnette LABOR, MD  Patient coming from:   HOME  I have personally reviewed patient's medical records, in electronic medical records, including:  [place] [facility], and care everywhere.    Chief Complaint:   Chief Complaint  Patient presents with   Facial Swelling    History of present illness:    Gabriela Conley is a 58 year old female with history of DM2, HTN, HLD, depression, thyroidism, GERD presented to ED with chief complaint of left frontal scalp, face, neck, periorbital edema and erythema. Patient reports that she gets these infrequent bumps on her skin and scalp that she picks at..   Similarly she found them on her scalp that she has been picking. Subsequently became inflamed, the erythema edema spread down to her forehead, facial neck and periorbital area.  Has not affected her eye or her vision. No difficulty with breathing.,  Complaining some mild headaches denies any shortness of breath or nausea.   ED Evaluation: Blood pressure 130/73, pulse 72, temperature 98.5 F (36.9 C), temperature source Oral, resp. rate 20, last menstrual period 12/04/2020, SpO2 98%.  Labs: CBC, WBC 11.3, lactic acid 2.3, glucose 273 CT head neck, maxillary facial, soft tissue neck_reporting: Scalp swelling extending into the left periorbital and lateral facial soft tissues without evidence of an abscess.  Mild left greater than right cervical lymphadenopathy, likely reactive, with mild inflammation in the left level 2b region. Left periorbital and lateral facial soft tissue swelling, further No intracranial abnormalities.  Blood cultures obtained, patient started on cefepime and vancomycin. Requested patient to be admitted for facial cellulitis.   Patient Denies having: Fever, Chills,  Cough, SOB, Chest Pain, Abd pain, N/V/D, headache, dizziness, lightheadedness,  Dysuria, Joint pain, rash, open wounds    Review of Systems: As per HPI, otherwise 10 point review of systems were negative.   ----------------------------------------------------------------------------------------------------------------------  Allergies  Allergen Reactions   Chocolate Anaphylaxis    Home MEDs:  Prior to Admission medications   Medication Sig Start Date End Date Taking? Authorizing Provider  acetaminophen  (TYLENOL ) 500 MG tablet Take 1 tablet (500 mg total) by mouth every 6 (six) hours as needed. 06/10/24   Nivia Colon, PA-C  albuterol  (VENTOLIN  HFA) 108 [phone] Base) MCG/ACT inhaler Inhale 1-2 puffs into the lungs every 6 (six) hours as needed for wheezing or shortness of breath. 07/02/24   Johnny Garnette LABOR, MD  atorvastatin  (LIPITOR) 10 MG tablet Take 1 tablet (10 mg total) by mouth daily. 04/28/24   Johnny Garnette LABOR, MD  augmented betamethasone  dipropionate (DIPROLENE -AF) 0.05 % cream Apply topically 2 (two) times daily. 11/21/22   Johnny Garnette LABOR, MD  fluticasone  (FLONASE ) 50 MCG/ACT nasal spray Place 1 spray into both nostrils daily. 02/09/23   Loreda Myla SAUNDERS, NP  glipiZIDE  (GLUCOTROL ) 10 MG tablet TAKE 1 TABLET (10 MG TOTAL) BY MOUTH TWICE A DAY BEFORE A MEAL 06/14/24   Johnny Garnette LABOR, MD  levothyroxine  (SYNTHROID ) 175 MCG tablet Take 1 tablet (175 mcg  total) by mouth daily before breakfast. 04/28/24   Johnny Garnette LABOR, MD  metFORMIN  (GLUCOPHAGE ) 1000 MG tablet TAKE 1 TABLET(1000 MG TOTAL) BY MOUTH DAILY WITH BREAKFAST 07/15/24   Johnny Garnette LABOR, MD  metroNIDAZOLE  (FLAGYL ) 500 MG tablet Take 1 tablet (500 mg total) by mouth 3 (three) times daily. 06/09/24   Johnny Garnette LABOR, MD  omeprazole  (PRILOSEC) 40 MG capsule Take 1 capsule (40 mg total) by mouth daily. 04/28/24   Johnny Garnette LABOR, MD  ondansetron  (ZOFRAN ) 4 MG tablet Take 1 tablet (4 mg total) by mouth every 8 (eight) hours as needed. 06/10/24   Nivia Colon, PA-C   pioglitazone  (ACTOS ) 45 MG tablet TAKE 1 TABLET BY MOUTH EVERY DAY IN THE MORNING 04/28/24   Johnny Garnette LABOR, MD  traMADol  (ULTRAM ) 50 MG tablet Take 2 tablets (100 mg total) by mouth every 6 (six) hours as needed for severe pain (pain score 7-10). 06/18/24   Johnny Garnette LABOR, MD  venlafaxine  XR (EFFEXOR -XR) 150 MG 24 hr capsule Take 1 capsule (150 mg total) by mouth daily with breakfast. 04/28/24   Johnny Garnette LABOR, MD    PRN MEDs: acetaminophen  **OR** acetaminophen , bisacodyl, hydrALAZINE, HYDROmorphone (DILAUDID) injection, ipratropium, ondansetron  **OR** ondansetron  (ZOFRAN ) IV, oxyCODONE, senna-docusate, sodium phosphate, traZODone  Past Medical History:  Diagnosis Date   Allergy    Anxiety    Asthma    Diabetes mellitus without complication (HCC)    gestational    GERD (gastroesophageal reflux disease)    Hypothyroid     Past Surgical History:  Procedure Laterality Date   BREAST SURGERY     removal of benign cyst    COLONOSCOPY  07/20/2020   per Dr. Eda, benign polyps, repeat in 7 yrs    CYSTECTOMY     removed from chest   ESOPHAGOGASTRODUODENOSCOPY  07/20/2020   per Dr. Eda, large hiatal hernia and gastritis    HERNIA REPAIR     umbilical    TUBAL LIGATION       reports that she has quit smoking. Her smoking use included cigarettes. She has never used smokeless tobacco. She reports current alcohol use. She reports current drug use. Drug: Marijuana.   Family History  Problem Relation Age of Onset   Uterine cancer Mother    Emphysema Mother    Pancreatic cancer Father    Heart disease Brother    Diabetes Maternal Uncle    Colon cancer Neg Hx    Esophageal cancer Neg Hx     Physical Exam:   Vitals:   10/05/24 1110 10/05/24 1308 10/05/24 1411  BP: (!) 162/90 132/86 130/73  Pulse: 84 79 72  Resp: 18  20  Temp: 98 F (36.7 C)  98.5 F (36.9 C)  TempSrc:   Oral  SpO2: 94% 98% 98%   Constitutional: NAD, calm, comfortable Eyes: PERRL, lids and conjunctivae  normal ENMT: Mucous membranes are moist. Posterior pharynx clear of any exudate or lesions.Normal dentition.  Neck: normal, supple, no masses, no thyromegaly Respiratory: clear to auscultation bilaterally, no wheezing, no crackles. Normal respiratory effort. No accessory muscle use.  Cardiovascular: Regular rate and rhythm, no murmurs / rubs / gallops. No extremity edema. 2+ pedal pulses. No carotid bruits.  Abdomen: no tenderness, no masses palpated. No hepatosplenomegaly. Bowel sounds positive.  Musculoskeletal: no clubbing / cyanosis. No joint deformity upper and lower extremities. Good ROM, no contractures. Normal muscle tone.  Neurologic: CN II-XII grossly intact. Sensation intact, DTR normal. Strength 5/5 in all 4.  Psychiatric: Normal judgment and insight. Alert and oriented x 3. Normal mood.  Skin: no rashes, lesions, ulcers. No induration Left scalp raised nodule, with scaling, scab, nondraining, erythema edema extending to the left facial area periorbital and neck area       Labs on admission:    I have personally reviewed following labs and imaging studies  CBC: Recent Labs  Lab 10/05/24 1314  WBC 11.3*  NEUTROABS 7.7  HGB 12.6  HCT 40.3  MCV 84.0  PLT 236   Basic Metabolic Panel: Recent Labs  Lab 10/05/24 1314  NA 136  K 3.6  CL 103  CO2 26  GLUCOSE 243*  BUN 6  CREATININE 0.74  CALCIUM  9.2   GFR: CrCl cannot be calculated (Unknown ideal weight.). Liver Function Tests: Recent Labs  Lab 10/05/24 1314  AST 19  ALT 20  ALKPHOS 101  BILITOT 0.3  PROT 7.5  ALBUMIN 3.9    Urine analysis:    Component Value Date/Time   COLORURINE YELLOW 06/10/2024 1111   APPEARANCEUR CLEAR 06/10/2024 1111   LABSPEC 1.023 06/10/2024 1111   PHURINE 5.0 06/10/2024 1111   GLUCOSEU NEGATIVE 06/10/2024 1111   HGBUR NEGATIVE 06/10/2024 1111   BILIRUBINUR NEGATIVE 06/10/2024 1111   BILIRUBINUR negative 05/18/2013 1123   KETONESUR 5 (A) 06/10/2024 1111   PROTEINUR  NEGATIVE 06/10/2024 1111   UROBILINOGEN 0.2 05/18/2013 1123   UROBILINOGEN 4.0 (H) 05/03/2013 1326   NITRITE NEGATIVE 06/10/2024 1111   LEUKOCYTESUR TRACE (A) 06/10/2024 1111    Last A1C:  Lab Results  Component Value Date   HGBA1C 7.8 (H) 12/08/2023     Radiologic Exams on Admission:   CT Soft Tissue Neck W Contrast Result Date: 10/05/2024 EXAM: CT NECK WITH CONTRAST 10/05/2024 02:49:18 PM TECHNIQUE: CT of the neck was performed with the administration of 75 mL of iohexol  (OMNIPAQUE ) 350 MG/ML injection. Multiplanar reformatted images are provided for review. Automated exposure control, iterative reconstruction, and/or weight based adjustment of the mA/kV was utilized to reduce the radiation dose to as low as reasonably achievable. COMPARISON: None available. CLINICAL HISTORY: Has what appears to be abscess on scalp with swelling down into face/neck. FINDINGS: AERODIGESTIVE TRACT: No discrete mass. No edema. SALIVARY GLANDS: The parotid and submandibular glands are unremarkable. THYROID : Unremarkable. LYMPH NODES: Increased number of small lymph nodes in the neck bilaterally, slightly more prominent on the left and the right and all subcentimeter in short axis, likely reactive. Mild fat stranding in the left level 2b region. SOFT TISSUES: Partially visualized left periorbital and lateral facial soft tissue swelling, more fully evaluated on the separate dedicated maxillofacial CT. BRAIN, ORBITS, SINUSES AND MASTOIDS: Reported separately on the dedicated CT examinations of the head and face. LUNGS AND MEDIASTINUM: No acute abnormality. BONES: Mild disc degeneration and moderate facet arthrosis in the cervical spine. No focal bone abnormality. IMPRESSION: 1. Mild left greater than right cervical lymphadenopathy, likely reactive, with mild inflammation in the left level 2b region. 2. Left periorbital and lateral facial soft tissue swelling, further characterized on dedicated maxillofacial CT.  Electronically signed by: Dasie Hamburg MD 10/05/2024 03:49 PM EST RP Workstation: [id number]   CT Maxillofacial W Contrast Result Date: 10/05/2024 EXAM: CT Face with contrast 10/05/2024 02:49:18 PM TECHNIQUE: CT of the face was performed with the administration of 75 mL of iohexol  (OMNIPAQUE ) 350 MG/ML injection. Multiplanar reformatted images are provided for review. Automated exposure control, iterative reconstruction, and/or weight based adjustment of the mA/kV was utilized to reduce the radiation dose  to as low as reasonably achievable. COMPARISON: None available CLINICAL HISTORY: Has what appears to be abscess on scalp with swelling down into face/neck. FINDINGS: SOFT TISSUES: Partially visualized left frontal scalp swelling as shown on the contemporaneous head CT. Swelling extends inferiorly into the forehead region bilaterally as well as asymmetrically into the left-sided periorbital soft tissues and lateral facial soft tissues. No organized fluid collection. BRAIN, ORBITS AND SINUSES: Mild scattered mucosal thickening in the paranasal sinuses. Clear mastoid air cells. No postseptal orbital inflammation. BONES: Dental caries, most notably involving the right maxillary 1st molar. No acute abnormality. No suspicious bone lesion. Remainder of the neck reported separately on the dedicated neck CT. IMPRESSION: 1. Scalp swelling extending into the left periorbital and lateral facial soft tissues without evidence of an abscess. Electronically signed by: Dasie Hamburg MD 10/05/2024 03:42 PM EST RP Workstation: [id number]   CT Head W or Wo Contrast Result Date: 10/05/2024 EXAM: CT HEAD WITHOUT AND WITH CONTRAST 10/05/2024 02:49:18 PM TECHNIQUE: CT of the head was performed without and with the administration of 75 mL of iohexol  (OMNIPAQUE ) 350 MG/ML injection. Automated exposure control, iterative reconstruction, and/or weight based adjustment of the mA/kV was utilized to reduce the radiation dose to as low as  reasonably achievable. COMPARISON: None available. CLINICAL HISTORY: has what appears to be abscess on scalp with swelling down into face/neck has what appears to be abscess on scalp with swelling down into face/neck FINDINGS: BRAIN AND VENTRICLES: There is no evidence of an acute infarct, intracranial hemorrhage, mass, midline shift, hydrocephalus, or extra-axial fluid collection. Cerebral volume is normal. No abnormal enhancement is identified. The dural venous sinuses are grossly patent. ORBITS: Reported on the separate maxillofacial CT. SINUSES AND MASTOIDS: Reported on the separate maxillofacial CT. SOFT TISSUES AND SKULL: Prominent left frontal scalp soft tissue swelling including a more focal 2 cm region of subcutaneous inflammation potentially reflecting phlegmon with overlying skin thickening, however no organized, clearly drainable abscess is identified. No underlying bone erosion. Swelling extends inferiorly into the left periorbital region. IMPRESSION: 1. Prominent left frontal scalp soft tissue swelling/potential cellulitis and phlegmon line without an organized drainable abscess or underlying bone erosion. 2. No acute intracranial abnormality. Electronically signed by: Dasie Hamburg MD 10/05/2024 03:37 PM EST RP Workstation: [id number]    EKG:   Independently reviewed.  Orders placed or performed during the hospital encounter of 10/05/24   EKG 12-Lead   ---------------------------------------------------------------------------------------------------------------------------------------    Assessment / Plan:   Principal Problem:   Facial cellulitis Active Problems:   Hypothyroidism   Anxiety   Dyslipidemia   Type 2 diabetes mellitus with other specified complication (HCC)   Assessment and Plan: * Facial cellulitis - Cellulitis is starting scalp, involving left frontotemporal facial periorbital neck area -left eye not involved, and no visual impairment  CT head, neck,  maxillofacial all reviewed in detail:  CBC, WBC 11.3, lactic acid 2.3,  - Will follow with a repeat lactic acid, fever curve,  CT head neck, maxillary facial, soft tissue neck_reporting: Scalp swelling extending into the left periorbital and lateral facial soft tissues without evidence of an abscess.  Mild left greater than right cervical lymphadenopathy, likely reactive, with mild inflammation in the left level 2b region. Left periorbital and lateral facial soft tissue swelling, further No intracranial abnormalities.  Blood cultures obtained, patient started on cefepime and vancomycin.... Will continue current antibiotics, will de-escalate over the next 24-48 hours -Follow-up blood cultures -Status post IV fluid hydration 1 L, will continue with maintenance fluids  Type 2 diabetes mellitus with other specified complication (HCC) Holding metformin , and glipizide  with last A1c of 7.8 -Repeating A1c -Checking her CBG Q ACHS, SSI coverage   Dyslipidemia Continue statins  Anxiety His anxiety, depression, continue home medication of Effexor   Hypothyroidism - Currently stable, continue home dose Synthroid      Consults called:  None -------------------------------------------------------------------------------------------------------------------------------------------- DVT prophylaxis:  heparin injection 5,000 Units Start: 10/05/24 1630 SCDs Start: 10/05/24 1627   Code Status:   Code Status: Full Code   Admission status: Patient will be admitted as Observation, with a greater than 2 midnight length of stay. Level of care: Med-Surg   Family Communication:  none at bedside  (The above findings and plan of care has been discussed with patient in detail, the patient expressed understanding and agreement of above plan)  --------------------------------------------------------------------------------------------------------------------------------------------------  Disposition  Plan:  Anticipated 1-2 days Status is: Observation The patient remains OBS appropriate and will d/c before 2 midnights.     ----------------------------------------------------------------------------------------------------------------------------------------------------  Time spent:  37  Min.  Was spent seeing and evaluating the patient, reviewing all medical records, drawn plan of care.  SIGNED: Adriana DELENA Grams, MD, FHM. FAAFP. [facility], Pager  (Please use [email or web address] to page/ or secure chat through epic) If 7PM-7AM, please contact night-coverage [email or web address],  10/05/2024, 4:52 PM

## 2024-10-05 NOTE — Assessment & Plan Note (Signed)
-   Cellulitis is starting scalp, involving left frontotemporal facial periorbital neck area -left eye not involved, and no visual impairment  CT head, neck, maxillofacial all reviewed in detail:  CBC, WBC 11.3, lactic acid 2.3,  - Will follow with a repeat lactic acid, fever curve,  CT head neck, maxillary facial, soft tissue neck_reporting: Scalp swelling extending into the left periorbital and lateral facial soft tissues without evidence of an abscess.  Mild left greater than right cervical lymphadenopathy, likely reactive, with mild inflammation in the left level 2b region. Left periorbital and lateral facial soft tissue swelling, further No intracranial abnormalities.  Blood cultures obtained, patient started on cefepime and vancomycin.... Will continue current antibiotics, will de-escalate over the next 24-48 hours -Follow-up blood cultures -Status post IV fluid hydration 1 L, will continue with maintenance fluids

## 2024-10-05 NOTE — Hospital Course (Signed)
 Gabriela Conley is a 57 year old female with history of DM2, HTN, HLD, depression, thyroidism, GERD presented to ED with chief complaint of left frontal scalp, face, neck, periorbital edema and erythema. Patient reports that she gets these infrequent bumps on her skin and scalp that she picks at..   Similarly she found them on her scalp that she has been picking. Subsequently became inflamed, the erythema edema spread down to her forehead, facial neck and periorbital area.  Has not affected her eye or her vision. No difficulty with breathing.,  Complaining some mild headaches denies any shortness of breath or nausea.   ED Evaluation: Blood pressure 130/73, pulse 72, temperature 98.5 F (36.9 C), temperature source Oral, resp. rate 20, last menstrual period 12/04/2020, SpO2 98%.  Labs: CBC, WBC 11.3, lactic acid 2.3, glucose 273 CT head neck, maxillary facial, soft tissue neck_reporting: Scalp swelling extending into the left periorbital and lateral facial soft tissues without evidence of an abscess.  Mild left greater than right cervical lymphadenopathy, likely reactive, with mild inflammation in the left level 2b region. Left periorbital and lateral facial soft tissue swelling, further No intracranial abnormalities.  Blood cultures obtained, patient started on cefepime and vancomycin. Requested patient to be admitted for facial cellulitis.

## 2024-10-05 NOTE — Assessment & Plan Note (Signed)
-   Currently stable, continue home dose Synthroid

## 2024-10-05 NOTE — TOC CM/SW Note (Signed)
 TOC consult received for d/c planning needs. CM to f/u with patient as appropriate.   Merilee Batty, MSN, RN Case Management [phone]

## 2024-10-05 NOTE — ED Provider Notes (Signed)
 [place] [facility]MENT AT [facility] Provider Note   CSN: [id number] Arrival date & time: 10/05/24  1107     Patient presents with: Facial Swelling   Gabriela Conley is a 58 y.o. female.   HPI     Presents because of swelling as well as bumps on the top of her head.  Patient states he has been noticing a bump with some slight clear drainage on their left frontal aspect of her scalp.  Has another 1 little bit more inferior to this.  Patient states that she has had some skin infections in the past of her arms that would drain clear fluid but subsequently go away on their own.  Has never had to have these lanced before.  Patient states that she has increased in size and pain over this area.  She started noticing today that she has swelling to her upper face down the left side of her face and maybe a little bit into her neck as well.  No obvious difficulty breathing or obvious difficulty swallowing this point time.  Chills but no documented fevers.  Endorsing a mild headache.  No chest pain or shortness of breath.  No nausea vomit diarrhea.  No recent changes in shampoo.  Did wash her hair with a new shampoo on the 28th but was asymptomatic this.  No new hair dye.  Previous medical history reviewed : Patient last seen in the ED in August 2025.  Was seen because abdominal pain.  Negative workup at that time.    Prior to Admission medications   Medication Sig Start Date End Date Taking? Authorizing Provider  acetaminophen  (TYLENOL ) 500 MG tablet Take 1 tablet (500 mg total) by mouth every 6 (six) hours as needed. 06/10/24   Nivia Colon, PA-C  albuterol  (VENTOLIN  HFA) [phone] Base) MCG/ACT inhaler Inhale 1-2 puffs into the lungs every 6 (six) hours as needed for wheezing or shortness of breath. 07/02/24   Johnny Garnette LABOR, MD  atorvastatin  (LIPITOR) 10 MG tablet Take 1 tablet (10 mg total) by mouth daily. 04/28/24   Johnny Garnette LABOR, MD  augmented betamethasone  dipropionate  (DIPROLENE -AF) 0.05 % cream Apply topically 2 (two) times daily. 11/21/22   Johnny Garnette LABOR, MD  fluticasone  (FLONASE ) 50 MCG/ACT nasal spray Place 1 spray into both nostrils daily. 02/09/23   Mayer, Jodi R, NP  glipiZIDE  (GLUCOTROL ) 10 MG tablet TAKE 1 TABLET (10 MG TOTAL) BY MOUTH TWICE A DAY BEFORE A MEAL 06/14/24   Johnny Garnette LABOR, MD  levothyroxine  (SYNTHROID ) 175 MCG tablet Take 1 tablet (175 mcg total) by mouth daily before breakfast. 04/28/24   Johnny Garnette LABOR, MD  metFORMIN  (GLUCOPHAGE ) 1000 MG tablet TAKE 1 TABLET(1000 MG TOTAL) BY MOUTH DAILY WITH BREAKFAST 07/15/24   Johnny Garnette LABOR, MD  metroNIDAZOLE  (FLAGYL ) 500 MG tablet Take 1 tablet (500 mg total) by mouth 3 (three) times daily. 06/09/24   Johnny Garnette LABOR, MD  omeprazole  (PRILOSEC) 40 MG capsule Take 1 capsule (40 mg total) by mouth daily. 04/28/24   Johnny Garnette LABOR, MD  ondansetron  (ZOFRAN ) 4 MG tablet Take 1 tablet (4 mg total) by mouth every 8 (eight) hours as needed. 06/10/24   Nivia Colon, PA-C  pioglitazone  (ACTOS ) 45 MG tablet TAKE 1 TABLET BY MOUTH EVERY DAY IN THE MORNING 04/28/24   Johnny Garnette LABOR, MD  traMADol  (ULTRAM ) 50 MG tablet Take 2 tablets (100 mg total) by mouth every 6 (six) hours as needed for severe pain (pain  score 7-10). 06/18/24   Johnny Garnette LABOR, MD  venlafaxine  XR (EFFEXOR -XR) 150 MG 24 hr capsule Take 1 capsule (150 mg total) by mouth daily with breakfast. 04/28/24   Johnny Garnette LABOR, MD    Allergies: Chocolate    Review of Systems  Constitutional:  Negative for chills and fever.  HENT:  Negative for ear pain and sore throat.   Eyes:  Negative for pain and visual disturbance.  Respiratory:  Negative for cough and shortness of breath.   Cardiovascular:  Negative for chest pain and palpitations.  Gastrointestinal:  Negative for abdominal pain and vomiting.  Genitourinary:  Negative for dysuria and hematuria.  Musculoskeletal:  Negative for arthralgias and back pain.  Skin:  Negative for color change and rash.   Neurological:  Negative for seizures and syncope.  All other systems reviewed and are negative.   Updated Vital Signs BP 130/73 (BP Location: Right Arm)   Pulse 72   Temp 98.5 F (36.9 C) (Oral)   Resp 20   LMP 12/04/2020 (Approximate)   SpO2 98%   Physical Exam Vitals and nursing note reviewed.  Constitutional:      General: She is not in acute distress.    Appearance: She is well-developed.  HENT:     Head: Normocephalic and atraumatic.   Eyes:     Conjunctiva/sclera: Conjunctivae normal.  Cardiovascular:     Rate and Rhythm: Normal rate and regular rhythm.     Heart sounds: No murmur heard. Pulmonary:     Effort: Pulmonary effort is normal. No respiratory distress.     Breath sounds: Normal breath sounds.  Abdominal:     Palpations: Abdomen is soft.     Tenderness: There is no abdominal tenderness.  Musculoskeletal:        General: No swelling.     Cervical back: Neck supple.  Skin:    General: Skin is warm and dry.     Capillary Refill: Capillary refill takes less than 2 seconds.  Neurological:     Mental Status: She is alert.  Psychiatric:        Mood and Affect: Mood normal.     (all labs ordered are listed, but only abnormal results are displayed) Labs Reviewed  COMPREHENSIVE METABOLIC PANEL WITH GFR - Abnormal; Notable for the following components:      Result Value   Glucose, Bld 243 (*)    All other components within normal limits  CBC WITH DIFFERENTIAL/PLATELET - Abnormal; Notable for the following components:   WBC 11.3 (*)    Monocytes Absolute 1.1 (*)    All other components within normal limits  I-STAT CG4 LACTIC ACID, ED - Abnormal; Notable for the following components:   Lactic Acid, Venous 2.3 (*)    All other components within normal limits  CULTURE, BLOOD (ROUTINE X 2)  CULTURE, BLOOD (ROUTINE X 2)  EXPECTORATED SPUTUM ASSESSMENT W GRAM STAIN, RFLX TO RESP C  CBC  CREATININE, SERUM  HIV ANTIBODY (ROUTINE TESTING W REFLEX)   MAGNESIUM  PHOSPHORUS  PROTIME-INR  HEMOGLOBIN A1C  I-STAT CG4 LACTIC ACID, ED    EKG: None  Radiology: CT Soft Tissue Neck W Contrast Result Date: 10/05/2024 EXAM: CT NECK WITH CONTRAST 10/05/2024 02:49:18 PM TECHNIQUE: CT of the neck was performed with the administration of 75 mL of iohexol  (OMNIPAQUE ) 350 MG/ML injection. Multiplanar reformatted images are provided for review. Automated exposure control, iterative reconstruction, and/or weight based adjustment of the mA/kV was utilized to reduce the radiation dose  to as low as reasonably achievable. COMPARISON: None available. CLINICAL HISTORY: Has what appears to be abscess on scalp with swelling down into face/neck. FINDINGS: AERODIGESTIVE TRACT: No discrete mass. No edema. SALIVARY GLANDS: The parotid and submandibular glands are unremarkable. THYROID : Unremarkable. LYMPH NODES: Increased number of small lymph nodes in the neck bilaterally, slightly more prominent on the left and the right and all subcentimeter in short axis, likely reactive. Mild fat stranding in the left level 2b region. SOFT TISSUES: Partially visualized left periorbital and lateral facial soft tissue swelling, more fully evaluated on the separate dedicated maxillofacial CT. BRAIN, ORBITS, SINUSES AND MASTOIDS: Reported separately on the dedicated CT examinations of the head and face. LUNGS AND MEDIASTINUM: No acute abnormality. BONES: Mild disc degeneration and moderate facet arthrosis in the cervical spine. No focal bone abnormality. IMPRESSION: 1. Mild left greater than right cervical lymphadenopathy, likely reactive, with mild inflammation in the left level 2b region. 2. Left periorbital and lateral facial soft tissue swelling, further characterized on dedicated maxillofacial CT. Electronically signed by: Dasie Hamburg MD 10/05/2024 03:49 PM EST RP Workstation: [id number]   CT Maxillofacial W Contrast Result Date: 10/05/2024 EXAM: CT Face with contrast 10/05/2024 02:49:18  PM TECHNIQUE: CT of the face was performed with the administration of 75 mL of iohexol  (OMNIPAQUE ) 350 MG/ML injection. Multiplanar reformatted images are provided for review. Automated exposure control, iterative reconstruction, and/or weight based adjustment of the mA/kV was utilized to reduce the radiation dose to as low as reasonably achievable. COMPARISON: None available CLINICAL HISTORY: Has what appears to be abscess on scalp with swelling down into face/neck. FINDINGS: SOFT TISSUES: Partially visualized left frontal scalp swelling as shown on the contemporaneous head CT. Swelling extends inferiorly into the forehead region bilaterally as well as asymmetrically into the left-sided periorbital soft tissues and lateral facial soft tissues. No organized fluid collection. BRAIN, ORBITS AND SINUSES: Mild scattered mucosal thickening in the paranasal sinuses. Clear mastoid air cells. No postseptal orbital inflammation. BONES: Dental caries, most notably involving the right maxillary 1st molar. No acute abnormality. No suspicious bone lesion. Remainder of the neck reported separately on the dedicated neck CT. IMPRESSION: 1. Scalp swelling extending into the left periorbital and lateral facial soft tissues without evidence of an abscess. Electronically signed by: Dasie Hamburg MD 10/05/2024 03:42 PM EST RP Workstation: [id number]   CT Head W or Wo Contrast Result Date: 10/05/2024 EXAM: CT HEAD WITHOUT AND WITH CONTRAST 10/05/2024 02:49:18 PM TECHNIQUE: CT of the head was performed without and with the administration of 75 mL of iohexol  (OMNIPAQUE ) 350 MG/ML injection. Automated exposure control, iterative reconstruction, and/or weight based adjustment of the mA/kV was utilized to reduce the radiation dose to as low as reasonably achievable. COMPARISON: None available. CLINICAL HISTORY: has what appears to be abscess on scalp with swelling down into face/neck has what appears to be abscess on scalp with swelling down  into face/neck FINDINGS: BRAIN AND VENTRICLES: There is no evidence of an acute infarct, intracranial hemorrhage, mass, midline shift, hydrocephalus, or extra-axial fluid collection. Cerebral volume is normal. No abnormal enhancement is identified. The dural venous sinuses are grossly patent. ORBITS: Reported on the separate maxillofacial CT. SINUSES AND MASTOIDS: Reported on the separate maxillofacial CT. SOFT TISSUES AND SKULL: Prominent left frontal scalp soft tissue swelling including a more focal 2 cm region of subcutaneous inflammation potentially reflecting phlegmon with overlying skin thickening, however no organized, clearly drainable abscess is identified. No underlying bone erosion. Swelling extends inferiorly into the left periorbital  region. IMPRESSION: 1. Prominent left frontal scalp soft tissue swelling/potential cellulitis and phlegmon line without an organized drainable abscess or underlying bone erosion. 2. No acute intracranial abnormality. Electronically signed by: Dasie Hamburg MD 10/05/2024 03:37 PM EST RP Workstation: [id number]     Procedures   Medications Ordered in the ED  heparin injection 5,000 Units (has no administration in time range)  sodium chloride  flush (NS) 0.9 % injection 3 mL (has no administration in time range)  0.9 %  sodium chloride  infusion (has no administration in time range)  sodium chloride  flush (NS) 0.9 % injection 3 mL (has no administration in time range)  acetaminophen  (TYLENOL ) tablet 650 mg (has no administration in time range)    Or  acetaminophen  (TYLENOL ) suppository 650 mg (has no administration in time range)  oxyCODONE (Oxy IR/ROXICODONE) immediate release tablet 5 mg (has no administration in time range)  HYDROmorphone (DILAUDID) injection 0.5-1 mg (has no administration in time range)  traZODone (DESYREL) tablet 25 mg (has no administration in time range)  senna-docusate (Senokot-S) tablet 1 tablet (has no administration in time range)   bisacodyl (DULCOLAX) EC tablet 5 mg (has no administration in time range)  sodium phosphate (FLEET) enema 1 enema (has no administration in time range)  ondansetron  (ZOFRAN ) tablet 4 mg (has no administration in time range)    Or  ondansetron  (ZOFRAN ) injection 4 mg (has no administration in time range)  ipratropium (ATROVENT) nebulizer solution 0.5 mg (has no administration in time range)  hydrALAZINE (APRESOLINE) injection 10 mg (has no administration in time range)  insulin aspart (novoLOG) injection 0-9 Units (has no administration in time range)  ceFEPIme (MAXIPIME) 2 g in sodium chloride  0.9 % 100 mL IVPB (has no administration in time range)  vancomycin (VANCOREADY) IVPB 1500 mg/300 mL (has no administration in time range)  Tdap (ADACEL) injection 0.5 mL (0.5 mLs Intramuscular Given 10/05/24 1245)  ketorolac  (TORADOL ) 15 MG/ML injection 15 mg (15 mg Intravenous Given 10/05/24 1313)  iohexol  (OMNIPAQUE ) 350 MG/ML injection 75 mL (75 mLs Intravenous Contrast Given 10/05/24 1450)  sodium chloride  0.9 % bolus 1,000 mL (1,000 mLs Intravenous New Bag/Given 10/05/24 1627)                                    Medical Decision Making Amount and/or Complexity of Data Reviewed Radiology: ordered.  Risk Prescription drug management. Decision regarding hospitalization.     HPI:   Presents because of swelling as well as bumps on the top of her head.  Patient states he has been noticing a bump with some slight clear drainage on their left frontal aspect of her scalp.  Has another 1 little bit more inferior to this.  Patient states that she has had some skin infections in the past of her arms that would drain clear fluid but subsequently go away on their own.  Has never had to have these lanced before.  Patient states that she has increased in size and pain over this area.  She started noticing today that she has swelling to her upper face down the left side of her face and maybe a little bit  into her neck as well.  No obvious difficulty breathing or obvious difficulty swallowing this point time.  Chills but no documented fevers.  Endorsing a mild headache.  No chest pain or shortness of breath.  No nausea vomit diarrhea.  No recent changes in shampoo.  Did wash her hair with a new shampoo on the 28th but was asymptomatic this.  No new hair dye.  Previous medical history reviewed : Patient last seen in the ED in August 2025.  Was seen because abdominal pain.  Negative workup at that time.   MDM:   Upon exam, patient hemodynamically stable.  A and O x 3 GCS 15.  Afebrile.  Normotensive.  Patient has pain to palpation over what looks like a small abscess on the top of her scalp.  Left frontal area.  She has swelling down into her face and side of the left side of her face and maybe a little bit in her left neck.  Question whether or not there is anything to drain its point time.  Will obtain laboratory workup as well as CT scan of the patient's head, face as well as neck.  Cellulitis versus abscess versus allergic reaction.  No concerns for orbital cellulitis at this time.  Normal vision.  No pain in the eyes.   Reevaluation:   Upon reexamination, patient hemodynamically stable.  Remains A&O x 3 with GCS 15.  Patient does have a leukocytosis.  Elevated lactic acid as well.  Obtaining blood cultures.  Did review CT scan.  Looks like phlegmon with cellulitis.  No obvious drainable abscess at this point time.  Nothing to open up.  Therefore, will not incise the area but rather treat with just antibiotics at this point in time.  No concerns and can orbital cellulitis as above.  Patient started on ceftriaxone and vancomycin.     I have independently interpreted the  CT  images and agree with the radiologist finding   Social Determinant of Health: denies IV drug abuse    Disposition and Follow Up: admit       Final diagnoses:  Cellulitis of head except face  Phlegmon     ED Discharge Orders     None          Simon Lavonia SAILOR, MD 10/05/24 [phone]

## 2024-10-05 NOTE — Progress Notes (Signed)
 Pharmacy Antibiotic Note  Gabriela Conley is a 58 y.o. female admitted on 10/05/2024 presenting with concern for cellulitis.  Pharmacy has been consulted for vancomycin dosing.  Plan: Vancomycin 1500 mg IV x 1, then 1250 mg IV q 24h (eAUC 502) Monitor renal function, Cx and clinical progression to narrow Vancomycin levels as indicated      Temp (24hrs), Avg:98.3 F (36.8 C), Min:98 F (36.7 C), Max:98.5 F (36.9 C)  Recent Labs  Lab 10/05/24 1314 10/05/24 1345  WBC 11.3*  --   CREATININE 0.74  --   LATICACIDVEN  --  2.3*    CrCl cannot be calculated (Unknown ideal weight.).    Allergies  Allergen Reactions   Chocolate Anaphylaxis    Dorn Poot, PharmD, [facility] Clinical Pharmacist ED Pharmacist Phone # [phone] 10/05/2024 4:48 PM

## 2024-10-05 NOTE — Progress Notes (Signed)
 ED Pharmacy Antibiotic Sign Off An antibiotic consult was received from an ED provider for vancomycin per pharmacy dosing for cellulitis. A chart review was completed to assess appropriateness.   The following one time order(s) were placed:  Vancomycin 1500 mg Iv x 1  Further antibiotic and/or antibiotic pharmacy consults should be ordered by the admitting provider if indicated.   Thank you for allowing pharmacy to be a part of this patient's care.   Dorn Poot, [facility]  Clinical Pharmacist 10/05/24 3:50 PM

## 2024-10-05 NOTE — Assessment & Plan Note (Addendum)
 Holding metformin , and glipizide  with last A1c of 7.8 -Repeating A1c -Checking her CBG Q ACHS, SSI coverage

## 2024-10-05 NOTE — ED Notes (Signed)
 Patient taken back to room before I had a chance to draw labs. KIT

## 2024-10-05 NOTE — ED Triage Notes (Signed)
 Patient arrives c/o facial swelling that she noticed around 10am. Patient reports wound on the top L scalp area that she picked at and put peroxide on, now area is open and swollen, ~1in wide, redness extends out ~2in,  area of swelling is significantly larger and swelling in her face extends down to her neck. Patient is having difficulty seeing out her L eye d/t swelling in the area. Patient endorses additional bullous sores that she gets on her arms that her PCP says are related to her liver.

## 2024-10-05 NOTE — ED Provider Triage Note (Signed)
 Emergency Medicine Provider Triage Evaluation Note  Gabriela Conley , a 58 y.o. female  was evaluated in triage.  Pt complains of scalp wound. Report she has some scalp irritation x 5 days, which she has been picking at it and clean it with peroxide.  It's getting larger, now today she notice her face is swollen.  No fever, neck pain, or trauma.  Endorse itch and pain.  Unsure last tdap.  Hx of DM, takes Metformin   Review of Systems  Positive: As above Negative: As above  Physical Exam  BP (!) 162/90 (BP Location: Right Arm)   Pulse 84   Temp 98 F (36.7 C)   Resp 18   LMP 12/04/2020 (Approximate)   SpO2 94%  Gen:   Awake, no distress   Resp:  Normal effort  MSK:   Moves extremities without difficulty  Other:  Two area of induration and mild fluctuance noted to L frontal scalp with dependent edema down to face  Medical Decision Making  Medically screening exam initiated at 12:11 PM.  Appropriate orders placed.  Mariadel Mruk was informed that the remainder of the evaluation will be completed by another provider, this initial triage assessment does not replace that evaluation, and the importance of remaining in the ED until their evaluation is complete.     Nivia Colon, PA-C 10/05/24 1213

## 2024-10-06 DIAGNOSIS — F32A Depression, unspecified: Secondary | ICD-10-CM | POA: Diagnosis not present

## 2024-10-06 DIAGNOSIS — Z793 Long term (current) use of hormonal contraceptives: Secondary | ICD-10-CM | POA: Diagnosis not present

## 2024-10-06 DIAGNOSIS — Z79899 Other long term (current) drug therapy: Secondary | ICD-10-CM | POA: Diagnosis not present

## 2024-10-06 DIAGNOSIS — Z91018 Allergy to other foods: Secondary | ICD-10-CM | POA: Diagnosis not present

## 2024-10-06 DIAGNOSIS — L409 Psoriasis, unspecified: Secondary | ICD-10-CM | POA: Diagnosis not present

## 2024-10-06 DIAGNOSIS — L0291 Cutaneous abscess, unspecified: Secondary | ICD-10-CM | POA: Diagnosis not present

## 2024-10-06 DIAGNOSIS — K219 Gastro-esophageal reflux disease without esophagitis: Secondary | ICD-10-CM | POA: Diagnosis not present

## 2024-10-06 DIAGNOSIS — E119 Type 2 diabetes mellitus without complications: Secondary | ICD-10-CM | POA: Diagnosis not present

## 2024-10-06 DIAGNOSIS — R590 Localized enlarged lymph nodes: Secondary | ICD-10-CM | POA: Diagnosis not present

## 2024-10-06 DIAGNOSIS — L03211 Cellulitis of face: Secondary | ICD-10-CM | POA: Diagnosis not present

## 2024-10-06 DIAGNOSIS — Z87891 Personal history of nicotine dependence: Secondary | ICD-10-CM | POA: Diagnosis not present

## 2024-10-06 DIAGNOSIS — Z7984 Long term (current) use of oral hypoglycemic drugs: Secondary | ICD-10-CM | POA: Diagnosis not present

## 2024-10-06 DIAGNOSIS — Z833 Family history of diabetes mellitus: Secondary | ICD-10-CM | POA: Diagnosis not present

## 2024-10-06 DIAGNOSIS — I10 Essential (primary) hypertension: Secondary | ICD-10-CM | POA: Diagnosis not present

## 2024-10-06 DIAGNOSIS — E785 Hyperlipidemia, unspecified: Secondary | ICD-10-CM | POA: Diagnosis not present

## 2024-10-06 DIAGNOSIS — F419 Anxiety disorder, unspecified: Secondary | ICD-10-CM | POA: Diagnosis not present

## 2024-10-06 DIAGNOSIS — Z79890 Hormone replacement therapy (postmenopausal): Secondary | ICD-10-CM | POA: Diagnosis not present

## 2024-10-06 DIAGNOSIS — Z8249 Family history of ischemic heart disease and other diseases of the circulatory system: Secondary | ICD-10-CM | POA: Diagnosis not present

## 2024-10-06 DIAGNOSIS — E039 Hypothyroidism, unspecified: Secondary | ICD-10-CM | POA: Diagnosis not present

## 2024-10-06 LAB — APTT: aPTT: 28 s (ref 24–36)

## 2024-10-06 LAB — CBC
HCT: 34.4 % — ABNORMAL LOW (ref 36.0–46.0)
Hemoglobin: 11 g/dL — ABNORMAL LOW (ref 12.0–15.0)
MCH: 26.9 pg (ref 26.0–34.0)
MCHC: 32 g/dL (ref 30.0–36.0)
MCV: 84.1 fL (ref 80.0–100.0)
Platelets: 194 K/uL (ref 150–400)
RBC: 4.09 MIL/uL (ref 3.87–5.11)
RDW: 15.3 % (ref 11.5–15.5)
WBC: 11.2 K/uL — ABNORMAL HIGH (ref 4.0–10.5)
nRBC: 0 % (ref 0.0–0.2)

## 2024-10-06 LAB — GLUCOSE, CAPILLARY
Glucose-Capillary: 162 mg/dL — ABNORMAL HIGH (ref 70–99)
Glucose-Capillary: 140 mg/dL — ABNORMAL HIGH (ref 70–99)
Glucose-Capillary: 193 mg/dL — ABNORMAL HIGH (ref 70–99)
Glucose-Capillary: 218 mg/dL — ABNORMAL HIGH (ref 70–99)

## 2024-10-06 LAB — HEMOGLOBIN A1C
Hgb A1c MFr Bld: 7.7 % — ABNORMAL HIGH (ref 4.8–5.6)
Mean Plasma Glucose: 174 mg/dL

## 2024-10-06 MED ORDER — CEFAZOLIN SODIUM-DEXTROSE 2-4 GM/100ML-% IV SOLN
2.0000 g | Freq: Three times a day (TID) | INTRAVENOUS | Status: DC
  Administered 2024-10-06 – 2024-10-07 (×4): 2 g via INTRAVENOUS
  Filled 2024-10-06 (×4): qty 100

## 2024-10-06 MED ORDER — PROCHLORPERAZINE MALEATE 5 MG PO TABS
5.0000 mg | ORAL_TABLET | Freq: Four times a day (QID) | ORAL | Status: DC | PRN
  Administered 2024-10-06: 5 mg via ORAL
  Filled 2024-10-06 (×2): qty 1

## 2024-10-06 NOTE — Plan of Care (Signed)

## 2024-10-06 NOTE — Plan of Care (Signed)
 Problem: Education: Goal: Ability to describe self-care measures that may prevent or decrease complications (Diabetes Survival Skills Education) will improve 10/06/2024 1827 by Eveline Laymon CROME, RN Outcome: Progressing 10/06/2024 1827 by Eveline Laymon CROME, RN Outcome: Progressing Goal: Individualized Educational Video(s) 10/06/2024 1827 by Eveline Laymon CROME, RN Outcome: Progressing 10/06/2024 1827 by Eveline Laymon CROME, RN Outcome: Progressing   Problem: Coping: Goal: Ability to adjust to condition or change in health will improve 10/06/2024 1827 by Eveline Laymon CROME, RN Outcome: Progressing 10/06/2024 1827 by Eveline Laymon CROME, RN Outcome: Progressing   Problem: Fluid Volume: Goal: Ability to maintain a balanced intake and output will improve 10/06/2024 1827 by Eveline Laymon CROME, RN Outcome: Progressing 10/06/2024 1827 by Eveline Laymon CROME, RN Outcome: Progressing   Problem: Health Behavior/Discharge Planning: Goal: Ability to identify and utilize available resources and services will improve 10/06/2024 1827 by Eveline Laymon CROME, RN Outcome: Progressing 10/06/2024 1827 by Eveline Laymon CROME, RN Outcome: Progressing Goal: Ability to manage health-related needs will improve 10/06/2024 1827 by Eveline Laymon CROME, RN Outcome: Progressing 10/06/2024 1827 by Eveline Laymon CROME, RN Outcome: Progressing   Problem: Metabolic: Goal: Ability to maintain appropriate glucose levels will improve 10/06/2024 1827 by Eveline Laymon CROME, RN Outcome: Progressing 10/06/2024 1827 by Eveline Laymon CROME, RN Outcome: Progressing   Problem: Nutritional: Goal: Maintenance of adequate nutrition will improve 10/06/2024 1827 by Eveline Laymon CROME, RN Outcome: Progressing 10/06/2024 1827 by Eveline Laymon CROME, RN Outcome: Progressing Goal: Progress toward achieving an optimal weight will improve 10/06/2024 1827 by Eveline Laymon CROME, RN Outcome: Progressing 10/06/2024 1827 by Eveline Laymon CROME,  RN Outcome: Progressing   Problem: Skin Integrity: Goal: Risk for impaired skin integrity will decrease 10/06/2024 1827 by Eveline Laymon CROME, RN Outcome: Progressing 10/06/2024 1827 by Eveline Laymon CROME, RN Outcome: Progressing   Problem: Tissue Perfusion: Goal: Adequacy of tissue perfusion will improve 10/06/2024 1827 by Eveline Laymon CROME, RN Outcome: Progressing 10/06/2024 1827 by Eveline Laymon CROME, RN Outcome: Progressing   Problem: Education: Goal: Knowledge of General Education information will improve Description: Including pain rating scale, medication(s)/side effects and non-pharmacologic comfort measures 10/06/2024 1827 by Eveline Laymon CROME, RN Outcome: Progressing 10/06/2024 1827 by Eveline Laymon CROME, RN Outcome: Progressing   Problem: Health Behavior/Discharge Planning: Goal: Ability to manage health-related needs will improve 10/06/2024 1827 by Eveline Laymon CROME, RN Outcome: Progressing 10/06/2024 1827 by Eveline Laymon CROME, RN Outcome: Progressing   Problem: Clinical Measurements: Goal: Ability to maintain clinical measurements within normal limits will improve 10/06/2024 1827 by Eveline Laymon CROME, RN Outcome: Progressing 10/06/2024 1827 by Eveline Laymon CROME, RN Outcome: Progressing Goal: Will remain free from infection 10/06/2024 1827 by Eveline Laymon CROME, RN Outcome: Progressing 10/06/2024 1827 by Eveline Laymon CROME, RN Outcome: Progressing Goal: Diagnostic test results will improve 10/06/2024 1827 by Eveline Laymon CROME, RN Outcome: Progressing 10/06/2024 1827 by Eveline Laymon CROME, RN Outcome: Progressing Goal: Respiratory complications will improve 10/06/2024 1827 by Eveline Laymon CROME, RN Outcome: Progressing 10/06/2024 1827 by Eveline Laymon CROME, RN Outcome: Progressing Goal: Cardiovascular complication will be avoided 10/06/2024 1827 by Eveline Laymon CROME, RN Outcome: Progressing 10/06/2024 1827 by Eveline Laymon CROME, RN Outcome: Progressing    Problem: Activity: Goal: Risk for activity intolerance will decrease 10/06/2024 1827 by Eveline Laymon CROME, RN Outcome: Progressing 10/06/2024 1827 by Eveline Laymon CROME, RN Outcome: Progressing   Problem: Nutrition: Goal: Adequate nutrition will be maintained 10/06/2024 1827 by Eveline Laymon CROME, RN Outcome: Progressing 10/06/2024 1827 by Eveline Laymon CROME, RN Outcome: Progressing  Problem: Coping: Goal: Level of anxiety will decrease 10/06/2024 1827 by Eveline Laymon CROME, RN Outcome: Progressing 10/06/2024 1827 by Eveline Laymon CROME, RN Outcome: Progressing   Problem: Elimination: Goal: Will not experience complications related to bowel motility 10/06/2024 1827 by Eveline Laymon CROME, RN Outcome: Progressing 10/06/2024 1827 by Eveline Laymon CROME, RN Outcome: Progressing Goal: Will not experience complications related to urinary retention 10/06/2024 1827 by Eveline Laymon CROME, RN Outcome: Progressing 10/06/2024 1827 by Eveline Laymon CROME, RN Outcome: Progressing   Problem: Pain Managment: Goal: General experience of comfort will improve and/or be controlled 10/06/2024 1827 by Eveline Laymon CROME, RN Outcome: Progressing 10/06/2024 1827 by Eveline Laymon CROME, RN Outcome: Progressing   Problem: Safety: Goal: Ability to remain free from injury will improve 10/06/2024 1827 by Eveline Laymon CROME, RN Outcome: Progressing 10/06/2024 1827 by Eveline Laymon CROME, RN Outcome: Progressing   Problem: Skin Integrity: Goal: Risk for impaired skin integrity will decrease 10/06/2024 1827 by Eveline Laymon CROME, RN Outcome: Progressing 10/06/2024 1827 by Eveline Laymon CROME, RN Outcome: Progressing   Problem: Clinical Measurements: Goal: Ability to avoid or minimize complications of infection will improve 10/06/2024 1827 by Eveline Laymon CROME, RN Outcome: Progressing 10/06/2024 1827 by Eveline Laymon CROME, RN Outcome: Progressing   Problem: Skin Integrity: Goal: Skin integrity will  improve 10/06/2024 1827 by Eveline Laymon CROME, RN Outcome: Progressing 10/06/2024 1827 by Eveline Laymon CROME, RN Outcome: Progressing

## 2024-10-06 NOTE — Progress Notes (Signed)
 PROGRESS NOTE    Gabriela Conley  FMW:[id number] DOB: 07/06/1966 DOA: 10/05/2024 PCP: Johnny Garnette LABOR, MD  Subjective: No acute events overnight. Patient reports still having tenderness on her face, but was told by nursing that the redness is improved. Denied any vision changes.     Hospital Course: Gabriela Conley is a 58 year old female with history of DM2, HTN, HLD, depression, thyroidism, GERD presented to ED with chief complaint of left frontal scalp, face, neck, periorbital edema and erythema. Patient reports that she gets these infrequent bumps on her skin and scalp that she picks at..   Similarly she found them on her scalp that she has been picking. Subsequently became inflamed, the erythema edema spread down to her forehead, facial neck and periorbital area.  Has not affected her eye or her vision. No difficulty with breathing.,  Complaining some mild headaches denies any shortness of breath or nausea. Admitted for facial cellulitis     Assessment and Plan: Facial cellulitis - Cellulitis is starting around the scalp, involving left frontotemporal facial periorbital neck area, no visual impairment - CT head, neck, maxillofacial without any abscess  - f/u blood cultures - the erythema is improved, will de-escalate Abx to cefazolin and monitor response   Type 2 diabetes mellitus with other specified complication (HCC) Holding metformin , and glipizide  with last A1c of 7.8 -Repeating A1c -Checking her CBG Q ACHS, SSI coverage  Dyslipidemia Continue statins  Anxiety His anxiety, depression, continue home medication of Effexor   Hypothyroidism - Currently stable, continue home dose Synthroid    DVT prophylaxis: heparin injection 5,000 Units Start: 10/05/24 1630 SCDs Start: 10/05/24 1627  SQ Heparin   Code Status: Full Code Disposition Plan: Home Reason for continuing need for hospitalization: IV antibiotics   Objective: Vitals:   10/05/24 1808 10/05/24 2033 10/06/24 0500  10/06/24 0824  BP: 126/64 124/76 117/65 133/84  Pulse:  80 81 79  Resp: 18  17   Temp: 98.4 F (36.9 C) 98.3 F (36.8 C) 98.7 F (37.1 C) 99.4 F (37.4 C)  TempSrc: Oral Oral Oral Oral  SpO2: 93% 95%  95%   No intake or output data in the 24 hours ending 10/06/24 1543 There were no vitals filed for this visit.  Examination:  Physical Exam Vitals and nursing note reviewed.  Constitutional:      General: She is not in acute distress. Cardiovascular:     Rate and Rhythm: Normal rate.  Pulmonary:     Effort: No respiratory distress.     Breath sounds: No wheezing.  Abdominal:     General: There is no distension.     Tenderness: There is no abdominal tenderness.  Musculoskeletal:     Right lower leg: No edema.     Left lower leg: No edema.  Skin:    Comments: Erythema below orbital area, mild tenderness to palpation      Data Reviewed: I have personally reviewed following labs and imaging studies  CBC: Recent Labs  Lab 10/05/24 1314 10/06/24 0353  WBC 11.3* 11.2*  NEUTROABS 7.7  --   HGB 12.6 11.0*  HCT 40.3 34.4*  MCV 84.0 84.1  PLT 236 194   Basic Metabolic Panel: Recent Labs  Lab 10/05/24 1314 10/05/24 1919  NA 136  --   K 3.6  --   CL 103  --   CO2 26  --   GLUCOSE 243*  --   BUN 6  --   CREATININE 0.74  --   CALCIUM  9.2  --  MG  --  1.9  PHOS  --  3.3   GFR: CrCl cannot be calculated (Unknown ideal weight.). Liver Function Tests: Recent Labs  Lab 10/05/24 1314  AST 19  ALT 20  ALKPHOS 101  BILITOT 0.3  PROT 7.5  ALBUMIN 3.9   No results for input(s): LIPASE, AMYLASE in the last 168 hours. No results for input(s): AMMONIA in the last 168 hours. Coagulation Profile: No results for input(s): INR, PROTIME in the last 168 hours. Cardiac Enzymes: No results for input(s): CKTOTAL, CKMB, CKMBINDEX, TROPONINI in the last 168 hours. ProBNP, BNP (last 5 results) No results for input(s): PROBNP, BNP in the last 8760  hours. HbA1C: No results for input(s): HGBA1C in the last 72 hours. CBG: Recent Labs  Lab 10/05/24 1725 10/06/24 0822 10/06/24 1304  GLUCAP 128* 162* 140*   Lipid Profile: No results for input(s): CHOL, HDL, LDLCALC, TRIG, CHOLHDL, LDLDIRECT in the last 72 hours. Thyroid  Function Tests: No results for input(s): TSH, T4TOTAL, FREET4, T3FREE, THYROIDAB in the last 72 hours. Anemia Panel: No results for input(s): VITAMINB12, FOLATE, FERRITIN, TIBC, IRON, RETICCTPCT in the last 72 hours. Sepsis Labs: Recent Labs  Lab 10/05/24 1345  LATICACIDVEN 2.3*    Recent Results (from the past 240 hours)  Blood culture (routine x 2)     Status: None (Preliminary result)   Collection Time: 10/05/24  3:54 PM   Specimen: BLOOD  Result Value Ref Range Status   Specimen Description BLOOD SITE NOT SPECIFIED  Final   Special Requests   Final    BOTTLES DRAWN AEROBIC AND ANAEROBIC Blood Culture results may not be optimal due to an inadequate volume of blood received in culture bottles   Culture   Final    NO GROWTH < 24 HOURS Performed at [facility], [address] [place], [place] [address]    Report Status PENDING  Incomplete  Blood culture (routine x 2)     Status: None (Preliminary result)   Collection Time: 10/05/24  4:20 PM   Specimen: BLOOD  Result Value Ref Range Status   Specimen Description BLOOD SITE NOT SPECIFIED  Final   Special Requests   Final    BOTTLES DRAWN AEROBIC AND ANAEROBIC Blood Culture results may not be optimal due to an inadequate volume of blood received in culture bottles   Culture   Final    NO GROWTH < 24 HOURS Performed at [facility], [address], [place], [place] [address]    Report Status PENDING  Incomplete     Radiology Studies: CT Soft Tissue Neck W Contrast Result Date: 10/05/2024 EXAM: CT NECK WITH CONTRAST 10/05/2024 02:49:18 PM TECHNIQUE: CT of the neck was performed with the administration  of 75 mL of iohexol  (OMNIPAQUE ) 350 MG/ML injection. Multiplanar reformatted images are provided for review. Automated exposure control, iterative reconstruction, and/or weight based adjustment of the mA/kV was utilized to reduce the radiation dose to as low as reasonably achievable. COMPARISON: None available. CLINICAL HISTORY: Has what appears to be abscess on scalp with swelling down into face/neck. FINDINGS: AERODIGESTIVE TRACT: No discrete mass. No edema. SALIVARY GLANDS: The parotid and submandibular glands are unremarkable. THYROID : Unremarkable. LYMPH NODES: Increased number of small lymph nodes in the neck bilaterally, slightly more prominent on the left and the right and all subcentimeter in short axis, likely reactive. Mild fat stranding in the left level 2b region. SOFT TISSUES: Partially visualized left periorbital and lateral facial soft tissue swelling, more  fully evaluated on the separate dedicated maxillofacial CT. BRAIN, ORBITS, SINUSES AND MASTOIDS: Reported separately on the dedicated CT examinations of the head and face. LUNGS AND MEDIASTINUM: No acute abnormality. BONES: Mild disc degeneration and moderate facet arthrosis in the cervical spine. No focal bone abnormality. IMPRESSION: 1. Mild left greater than right cervical lymphadenopathy, likely reactive, with mild inflammation in the left level 2b region. 2. Left periorbital and lateral facial soft tissue swelling, further characterized on dedicated maxillofacial CT. Electronically signed by: Dasie Hamburg MD 10/05/2024 03:49 PM EST RP Workstation: [id number]   CT Maxillofacial W Contrast Result Date: 10/05/2024 EXAM: CT Face with contrast 10/05/2024 02:49:18 PM TECHNIQUE: CT of the face was performed with the administration of 75 mL of iohexol  (OMNIPAQUE ) 350 MG/ML injection. Multiplanar reformatted images are provided for review. Automated exposure control, iterative reconstruction, and/or weight based adjustment of the mA/kV was utilized  to reduce the radiation dose to as low as reasonably achievable. COMPARISON: None available CLINICAL HISTORY: Has what appears to be abscess on scalp with swelling down into face/neck. FINDINGS: SOFT TISSUES: Partially visualized left frontal scalp swelling as shown on the contemporaneous head CT. Swelling extends inferiorly into the forehead region bilaterally as well as asymmetrically into the left-sided periorbital soft tissues and lateral facial soft tissues. No organized fluid collection. BRAIN, ORBITS AND SINUSES: Mild scattered mucosal thickening in the paranasal sinuses. Clear mastoid air cells. No postseptal orbital inflammation. BONES: Dental caries, most notably involving the right maxillary 1st molar. No acute abnormality. No suspicious bone lesion. Remainder of the neck reported separately on the dedicated neck CT. IMPRESSION: 1. Scalp swelling extending into the left periorbital and lateral facial soft tissues without evidence of an abscess. Electronically signed by: Dasie Hamburg MD 10/05/2024 03:42 PM EST RP Workstation: [id number]   CT Head W or Wo Contrast Result Date: 10/05/2024 EXAM: CT HEAD WITHOUT AND WITH CONTRAST 10/05/2024 02:49:18 PM TECHNIQUE: CT of the head was performed without and with the administration of 75 mL of iohexol  (OMNIPAQUE ) 350 MG/ML injection. Automated exposure control, iterative reconstruction, and/or weight based adjustment of the mA/kV was utilized to reduce the radiation dose to as low as reasonably achievable. COMPARISON: None available. CLINICAL HISTORY: has what appears to be abscess on scalp with swelling down into face/neck has what appears to be abscess on scalp with swelling down into face/neck FINDINGS: BRAIN AND VENTRICLES: There is no evidence of an acute infarct, intracranial hemorrhage, mass, midline shift, hydrocephalus, or extra-axial fluid collection. Cerebral volume is normal. No abnormal enhancement is identified. The dural venous sinuses are grossly  patent. ORBITS: Reported on the separate maxillofacial CT. SINUSES AND MASTOIDS: Reported on the separate maxillofacial CT. SOFT TISSUES AND SKULL: Prominent left frontal scalp soft tissue swelling including a more focal 2 cm region of subcutaneous inflammation potentially reflecting phlegmon with overlying skin thickening, however no organized, clearly drainable abscess is identified. No underlying bone erosion. Swelling extends inferiorly into the left periorbital region. IMPRESSION: 1. Prominent left frontal scalp soft tissue swelling/potential cellulitis and phlegmon line without an organized drainable abscess or underlying bone erosion. 2. No acute intracranial abnormality. Electronically signed by: Dasie Hamburg MD 10/05/2024 03:37 PM EST RP Workstation: [id number]    Scheduled Meds:  atorvastatin   10 mg Oral Daily   heparin  5,000 Units Subcutaneous Q8H   insulin aspart  0-9 Units Subcutaneous TID WC   lactobacillus  1 g Oral TID WC   levothyroxine   175 mcg Oral QAC breakfast   pantoprazole   40  mg Oral Daily   sodium chloride  flush  3 mL Intravenous Q12H   sodium chloride  flush  3 mL Intravenous Q12H   venlafaxine  XR  150 mg Oral Q breakfast   Continuous Infusions:   ceFAZolin (ANCEF) IV       LOS: 0 days   Time spent: 35 minutes  Casimer Dare, MD  [facility]  10/06/2024, 3:43 PM

## 2024-10-06 NOTE — Progress Notes (Signed)
 Chaplain responded to a consult request for Advance Directive education. Chaplain introduced spiritual care and offered support in the setting of inpatient admission. Pt was accompanied by one of her daughters. She shared that if she were not able to make her own medical decisions, she would want that daughter to make them for her. They plan to review the documents together and will follow up with spiritual care if they are ready to complete while still inpatient.  Education provided to pt outlined below.   Chaplain provided the Advance Directive packet as well as education on Advance Directives-documents an individual completes to communicate their health care directions in advance of a time when they may need them. Chaplain informed pt the documents which may be completed here in the hospital are the Living Will and Health Care Power of [place].   Chaplain informed that the Health Care Power of [place] is a legal document in which an individual names another person, their Health Care Agent, to make health care decisions when the individual is not able to make them for themselves. The Health Care Agent's function can be temporary or permanent depending on the pt's ability to make and communicate those decisions independently. Chaplain informed pt in the absence of a Health Care Power of Attorney, the state of [place]  directs health care providers to look to the following individuals in the order listed: legal guardian; an attorney?in?fact under a general power of attorney (POA) if that POA includes the right to make health care decisions; a husband or wife; a majority of parents and adult children; a majority of adult brothers and sisters; or an individual who has an established relationship with you, who is acting in good faith and who can convey your wishes.  If none of these person are available or willing to make medical decisions on a patient's behalf, the law allows the patient's doctor to  make decisions for them as long as another doctor agrees with those decisions.  Chaplain also informed the patient that the Health Care agent has no decision-making authority over any affairs other than those related to his or her medical care.   The chaplain further educated the pt that a Living Will is a legal document that allows an individual to state his or her desire not to receive life-prolonging measures in the event that they have a condition that is incurable and will result in their death in a short period of time; they are unconscious, and doctors are confident that they will not regain consciousness; and/or they have advanced dementia or other substantial and irreversible loss of mental function. The chaplain informed pt that life-prolonging measures are medical treatments that would only serve to postpone death, including breathing machines, kidney dialysis, antibiotics, artificial nutrition and hydration (tube feeding), and similar forms of treatment and that if an individual is able to express their wishes, they may also make them known without the use of a Living Will, but in the event that an individual is not able to express their wishes themselves, a Living Will allows medical providers and the pt's family and friends ensure that they are not making decisions on the pt's behalf, but rather serving as the pt's voice to convey decisions the pt has already made.   The patient is aware that the decision to create an advance directive is theirs alone and they may chose not to complete the documents or may chose to complete one portion or both.  The patient was informed that  they can revoke the documents at any time by striking through them and writing void or by completing new documents, but that it is also advisable that the individual verbally notify interested parties that their wishes have changed.  They are also aware that the document must be signed in the presence of a notary public and  two witnesses and that this can be done while the patient is still admitted to the hospital or after discharge in the community. If they decide to complete Advance Directives after being discharged from the hospital, they have been advised to notify all interested parties and to provide those documents to their physicians and loved ones in addition to bringing them to the hospital in the event of another hospitalization.   The chaplain informed the pt that if they desire to proceed with completing Advance Directive Documentation while they are still admitted, notary services are typically available at [facility] between the hours of 1:00 and 3:30 Monday-Thursday.    When the patient is ready to have these documents completed, the patient should request that their nurse place a spiritual care consult and indicate that the patient is ready to have their advance directives notarized so that arrangements for witnesses and notary public can be made.  Please page spiritual care if the patient desires further education or has questions.        Alan HERO. Davee Lomax, M.Div. [facility] Chaplain Pager [phone] Office [phone]   10/06/24 1314  Spiritual Encounters  Type of Visit Initial  Care provided to: Pt and family  Conversation partners present during encounter Nurse  Reason for visit Advance directives  Advance Directives (For Healthcare)  Does Patient Have a Medical Advance Directive? No  Would patient like information on creating a medical advance directive? Yes (Inpatient - patient defers creating a medical advance directive at this time - Information given)

## 2024-10-06 NOTE — Plan of Care (Incomplete)

## 2024-10-06 NOTE — TOC CM/SW Note (Signed)
 Transition of Care [facility]) - Inpatient Brief Assessment   Patient Details  Name: Gabriela Conley MRN: [id number] Date of Birth: 09-Jan-1966  Transition of Care [facility]) CM/SW Contact:    Lauraine FORBES Saa, LCSWA Phone Number: 10/06/2024, 8:59 AM   Clinical Narrative:  8:59 AM Per chart review, patient resides at home alone. Patient has a PCP and insurance. Patient does not have SNF/HH/DME history. Patient's preferred pharmacy's are [facility] [address], [facility] [address] [facility] [address], [place] [address] in [employer] [place], and [facility] [place]. [facility] consult was placed for potential HH/DME needs. TOC will continue to follow.  Transition of Care Asessment: Insurance and Status: Insurance coverage has been reviewed Patient has primary care physician: Yes Home environment has been reviewed: Private Residence Prior level of function:: N/A Prior/Current Home Services: No current home services Social Drivers of Health Review: SDOH reviewed no interventions necessary Readmission risk has been reviewed: Yes (Currently Observation Status) Transition of care needs: transition of care needs identified, TOC will continue to follow

## 2024-10-07 DIAGNOSIS — L03211 Cellulitis of face: Secondary | ICD-10-CM

## 2024-10-07 LAB — CBC
HCT: 33.7 % — ABNORMAL LOW (ref 36.0–46.0)
Hemoglobin: 10.9 g/dL — ABNORMAL LOW (ref 12.0–15.0)
MCH: 26.7 pg (ref 26.0–34.0)
MCHC: 32.3 g/dL (ref 30.0–36.0)
MCV: 82.4 fL (ref 80.0–100.0)
Platelets: 216 K/uL (ref 150–400)
RBC: 4.09 MIL/uL (ref 3.87–5.11)
RDW: 14.7 % (ref 11.5–15.5)
WBC: 8.9 K/uL (ref 4.0–10.5)
nRBC: 0 % (ref 0.0–0.2)

## 2024-10-07 LAB — GLUCOSE, CAPILLARY
Glucose-Capillary: 157 mg/dL — ABNORMAL HIGH (ref 70–99)
Glucose-Capillary: 225 mg/dL — ABNORMAL HIGH (ref 70–99)

## 2024-10-07 MED ORDER — CEPHALEXIN 500 MG PO CAPS: 500.0000 mg | ORAL_CAPSULE | Freq: Three times a day (TID) | ORAL | 0 refills | Status: AC

## 2024-10-07 NOTE — Progress Notes (Signed)
 DISCHARGE NOTE HOME Gabriela Conley to be discharged Home per MD order. Discussed prescriptions and follow up appointments with the patient. Prescriptions given to patient; medication list explained in detail. Patient verbalized understanding.    After Visit Summary (AVS) was printed and given to the patient.  Floor will continue discharge after Anitbiotics finished  [name], RN

## 2024-10-07 NOTE — Plan of Care (Signed)
 Patient calm and cooperative A&O X4, patient able to ambulate to bathroom. Medications tolerated well, patient left with call bell in reach and bed in lowest position.   Problem: Education: Goal: Ability to describe self-care measures that may prevent or decrease complications (Diabetes Survival Skills Education) will improve Outcome: Progressing   Problem: Coping: Goal: Ability to adjust to condition or change in health will improve Outcome: Progressing   Problem: Health Behavior/Discharge Planning: Goal: Ability to identify and utilize available resources and services will improve Outcome: Progressing   Problem: Metabolic: Goal: Ability to maintain appropriate glucose levels will improve Outcome: Progressing   Problem: Nutritional: Goal: Maintenance of adequate nutrition will improve Outcome: Progressing   Problem: Skin Integrity: Goal: Risk for impaired skin integrity will decrease Outcome: Progressing   Problem: Education: Goal: Knowledge of General Education information will improve Description: Including pain rating scale, medication(s)/side effects and non-pharmacologic comfort measures Outcome: Progressing   Problem: Clinical Measurements: Goal: Ability to maintain clinical measurements within normal limits will improve Outcome: Progressing

## 2024-10-07 NOTE — Discharge Summary (Signed)
 [facility] Phys[facility] Discharge Summary   Patient name: Gabriela Conley  Admit date:     10/05/2024  Discharge date: 10/07/2024  Attending Physician: Javayah Magaw [[id number]]  Discharge Physician: Casimer Dare   PCP: Johnny Garnette LABOR, MD  Admitted From: Home  Disposition:  Home  Recommendations for Outpatient Follow-up:  Follow up with PCP in 1-2 weeks Complete Abx Follow up with derm for the psoriais and scalp lesion   Home Health:No  Discharge Condition:Stable CODE STATUS:FULL Diet recommendation: Heart Healthy/Diabetic Fluid Restriction: None  Hospital Summary: Gabriela Conley is a 58 year old female with history of DM2, HTN, HLD, depression, thyroidism, GERD presented to ED with chief complaint of left frontal scalp, face, neck, periorbital edema and erythema. Patient reports that she gets these infrequent bumps on her skin and scalp that she picks at..   Similarly she found them on her scalp that she has been picking. Subsequently became inflamed, the erythema edema spread down to her forehead, facial neck and periorbital area.  Has not affected her eye or her vision. No difficulty with breathing.,  Complaining some mild headaches denies any shortness of breath or nausea. Admitted for facial cellulitis    Hospital Course by Problem:  Facial cellulitis - Cellulitis is starting around the left sided frontal scalp, involving left frontotemporal facial periorbital neck area, no visual impairment - CT head, neck, maxillofacial without any abscess  - f/u blood cultures - erythema much improved, discharged on Keflex  to complete antibiotic course - was given a dermatology referral to address the lesion on the left sided frontal scalp with some psoriasis and oozing, which was likely the source of the cellulitis     Type 2 diabetes mellitus with other specified complication (HCC) - resumed home meds - follow up with PCP for further DM2 management    Dyslipidemia Continue statins    Anxiety - continue home meds    Hypothyroidism - continue home dose Synthroid     Discharge Diagnoses:  Principal Problem:   Facial cellulitis Active Problems:   Hypothyroidism   Anxiety   Dyslipidemia   Type 2 diabetes mellitus with other specified complication [facility])   Discharge Instructions  Discharge Instructions     Ambulatory referral to Dermatology   Complete by: As directed    Frontal scalp lesion with oozing   Is there a picture in the patient's chart?: No   Diet - low sodium heart healthy   Complete by: As directed    Discharge instructions   Complete by: As directed    1. Follow up your PCP  2. Use warm compresses on the scalp lesion, avoid high pressure water  3. Complete your antibiotics   Increase activity slowly   Complete by: As directed       Allergies as of 10/07/2024       Reactions   Chocolate Anaphylaxis        Medication List     STOP taking these medications    metroNIDAZOLE  500 MG tablet Commonly known as: FLAGYL        TAKE these medications    acetaminophen  500 MG tablet Commonly known as: TYLENOL  Take 1 tablet (500 mg total) by mouth every 6 (six) hours as needed. What changed:  when to take this reasons to take this   albuterol  108 (90 Base) MCG/ACT inhaler Commonly known as: VENTOLIN  HFA Inhale 1-2 puffs into the lungs every 6 (six) hours as needed for wheezing or shortness of breath.   atorvastatin  10 MG tablet Commonly  known as: LIPITOR Take 1 tablet (10 mg total) by mouth daily.   augmented betamethasone  dipropionate 0.05 % cream Commonly known as: DIPROLENE -AF Apply topically 2 (two) times daily. What changed:  how much to take when to take this   cephALEXin  500 MG capsule Commonly known as: KEFLEX  Take 1 capsule (500 mg total) by mouth 3 (three) times daily for 7 days.   fluticasone  50 MCG/ACT nasal spray Commonly known as: FLONASE  Place 1 spray into both nostrils daily.   glipiZIDE  10 MG  tablet Commonly known as: GLUCOTROL  TAKE 1 TABLET (10 MG TOTAL) BY MOUTH TWICE A DAY BEFORE A MEAL What changed: See the new instructions.   levothyroxine  175 MCG tablet Commonly known as: SYNTHROID  Take 1 tablet (175 mcg total) by mouth daily before breakfast.   metFORMIN  1000 MG tablet Commonly known as: GLUCOPHAGE  TAKE 1 TABLET(1000 MG TOTAL) BY MOUTH DAILY WITH BREAKFAST   omeprazole  40 MG capsule Commonly known as: PRILOSEC Take 1 capsule (40 mg total) by mouth daily.   ondansetron  4 MG tablet Commonly known as: ZOFRAN  Take 1 tablet (4 mg total) by mouth every 8 (eight) hours as needed. What changed:  when to take this reasons to take this   pioglitazone  45 MG tablet Commonly known as: ACTOS  TAKE 1 TABLET BY MOUTH EVERY DAY IN THE MORNING   traMADol  50 MG tablet Commonly known as: ULTRAM  Take 2 tablets (100 mg total) by mouth every 6 (six) hours as needed for severe pain (pain score 7-10).   venlafaxine  XR 150 MG 24 hr capsule Commonly known as: EFFEXOR -XR Take 1 capsule (150 mg total) by mouth daily with breakfast.        Allergies  Allergen Reactions   Chocolate Anaphylaxis    Discharge Exam: Vitals:   10/07/24 0434 10/07/24 0736  BP: (!) 130/91 132/73  Pulse: 75 72  Resp: 18 18  Temp: 98.5 F (36.9 C) 99.1 F (37.3 C)  SpO2: 95% 97%    Physical Exam Vitals and nursing note reviewed.  Constitutional:      General: She is not in acute distress. Cardiovascular:     Rate and Rhythm: Normal rate.  Abdominal:     General: There is no distension.     Tenderness: There is no abdominal tenderness.  Skin:    Comments: Much improved swelling and erythema around eyes and left sided facial area. Small soft lump on left sided frontal scalp with psoriatic skin appearance and oozing small amount of pus. Minimally tender      The results of significant diagnostics from this hospitalization (including imaging, microbiology, ancillary and laboratory) are  listed below for reference.    Microbiology: Recent Results (from the past 240 hours)  Blood culture (routine x 2)     Status: None (Preliminary result)   Collection Time: 10/05/24  3:54 PM   Specimen: BLOOD  Result Value Ref Range Status   Specimen Description BLOOD SITE NOT SPECIFIED  Final   Special Requests   Final    BOTTLES DRAWN AEROBIC AND ANAEROBIC Blood Culture results may not be optimal due to an inadequate volume of blood received in culture bottles   Culture   Final    NO GROWTH 2 DAYS Performed at [facility], [address] [place], [place] [address]    Report Status PENDING  Incomplete  Blood culture (routine x 2)     Status: None (Preliminary result)   Collection Time: 10/05/24  4:20 PM  Specimen: BLOOD  Result Value Ref Range Status   Specimen Description BLOOD SITE NOT SPECIFIED  Final   Special Requests   Final    BOTTLES DRAWN AEROBIC AND ANAEROBIC Blood Culture results may not be optimal due to an inadequate volume of blood received in culture bottles   Culture   Final    NO GROWTH 2 DAYS Performed at [facility], [address] [place], [place] [address]    Report Status PENDING  Incomplete     Labs: ProBNP, BNP (last 5 results) No results for input(s): PROBNP, BNP in the last 8760 hours. Basic Metabolic Panel: Recent Labs  Lab 10/05/24 1314 10/05/24 1919  NA 136  --   K 3.6  --   CL 103  --   CO2 26  --   GLUCOSE 243*  --   BUN 6  --   CREATININE 0.74  --   CALCIUM  9.2  --   MG  --  1.9  PHOS  --  3.3   Liver Function Tests: Recent Labs  Lab 10/05/24 1314  AST 19  ALT 20  ALKPHOS 101  BILITOT 0.3  PROT 7.5  ALBUMIN 3.9   No results for input(s): LIPASE, AMYLASE in the last 168 hours. No results for input(s): AMMONIA in the last 168 hours. CBC: Recent Labs  Lab 10/05/24 1314 10/06/24 0353 10/07/24 0439  WBC 11.3* 11.2* 8.9  NEUTROABS 7.7  --   --   HGB 12.6 11.0* 10.9*  HCT 40.3 34.4* 33.7*  MCV  84.0 84.1 82.4  PLT 236 194 216   Cardiac Enzymes: No results for input(s): CKTOTAL, CKMB, CKMBINDEX, TROPONINI, TROPONINIHS in the last 168 hours. BNP: No results for input(s): BNP in the last 168 hours. CBG: Recent Labs  Lab 10/06/24 0822 10/06/24 1304 10/06/24 1714 10/06/24 2118 10/07/24 0735  GLUCAP 162* 140* 193* 218* 157*   D-Dimer No results for input(s): DDIMER in the last 72 hours. Hgb A1c Recent Labs    10/05/24 1919  HGBA1C 7.7*   Lipid Profile No results for input(s): CHOL, HDL, LDLCALC, TRIG, CHOLHDL, LDLDIRECT in the last 72 hours. Thyroid  function studies No results for input(s): TSH, T4TOTAL, FREET4, T3FREE, THYROIDAB in the last 72 hours.  Invalid input(s): FREET3 Anemia work up No results for input(s): VITAMINB12, FOLATE, FERRITIN, TIBC, IRON, RETICCTPCT in the last 72 hours. Urinalysis    Component Value Date/Time   COLORURINE YELLOW 06/10/2024 1111   APPEARANCEUR CLEAR 06/10/2024 1111   LABSPEC 1.023 06/10/2024 1111   PHURINE 5.0 06/10/2024 1111   GLUCOSEU NEGATIVE 06/10/2024 1111   HGBUR NEGATIVE 06/10/2024 1111   BILIRUBINUR NEGATIVE 06/10/2024 1111   BILIRUBINUR negative 05/18/2013 1123   KETONESUR 5 (A) 06/10/2024 1111   PROTEINUR NEGATIVE 06/10/2024 1111   UROBILINOGEN 0.2 05/18/2013 1123   UROBILINOGEN 4.0 (H) 05/03/2013 1326   NITRITE NEGATIVE 06/10/2024 1111   LEUKOCYTESUR TRACE (A) 06/10/2024 1111   Sepsis Labs Recent Labs  Lab 10/05/24 1314 10/06/24 0353 10/07/24 0439  WBC 11.3* 11.2* 8.9    Procedures/Studies: CT Soft Tissue Neck W Contrast Result Date: 10/05/2024 EXAM: CT NECK WITH CONTRAST 10/05/2024 02:49:18 PM TECHNIQUE: CT of the neck was performed with the administration of 75 mL of iohexol  (OMNIPAQUE ) 350 MG/ML injection. Multiplanar reformatted images are provided for review. Automated exposure control, iterative reconstruction, and/or weight based adjustment of the  mA/kV was utilized to reduce the radiation dose to as low as reasonably achievable. COMPARISON: None available. CLINICAL HISTORY:  Has what appears to be abscess on scalp with swelling down into face/neck. FINDINGS: AERODIGESTIVE TRACT: No discrete mass. No edema. SALIVARY GLANDS: The parotid and submandibular glands are unremarkable. THYROID : Unremarkable. LYMPH NODES: Increased number of small lymph nodes in the neck bilaterally, slightly more prominent on the left and the right and all subcentimeter in short axis, likely reactive. Mild fat stranding in the left level 2b region. SOFT TISSUES: Partially visualized left periorbital and lateral facial soft tissue swelling, more fully evaluated on the separate dedicated maxillofacial CT. BRAIN, ORBITS, SINUSES AND MASTOIDS: Reported separately on the dedicated CT examinations of the head and face. LUNGS AND MEDIASTINUM: No acute abnormality. BONES: Mild disc degeneration and moderate facet arthrosis in the cervical spine. No focal bone abnormality. IMPRESSION: 1. Mild left greater than right cervical lymphadenopathy, likely reactive, with mild inflammation in the left level 2b region. 2. Left periorbital and lateral facial soft tissue swelling, further characterized on dedicated maxillofacial CT. Electronically signed by: Dasie Hamburg MD 10/05/2024 03:49 PM EST RP Workstation: [id number]   CT Maxillofacial W Contrast Result Date: 10/05/2024 EXAM: CT Face with contrast 10/05/2024 02:49:18 PM TECHNIQUE: CT of the face was performed with the administration of 75 mL of iohexol  (OMNIPAQUE ) 350 MG/ML injection. Multiplanar reformatted images are provided for review. Automated exposure control, iterative reconstruction, and/or weight based adjustment of the mA/kV was utilized to reduce the radiation dose to as low as reasonably achievable. COMPARISON: None available CLINICAL HISTORY: Has what appears to be abscess on scalp with swelling down into face/neck. FINDINGS: SOFT  TISSUES: Partially visualized left frontal scalp swelling as shown on the contemporaneous head CT. Swelling extends inferiorly into the forehead region bilaterally as well as asymmetrically into the left-sided periorbital soft tissues and lateral facial soft tissues. No organized fluid collection. BRAIN, ORBITS AND SINUSES: Mild scattered mucosal thickening in the paranasal sinuses. Clear mastoid air cells. No postseptal orbital inflammation. BONES: Dental caries, most notably involving the right maxillary 1st molar. No acute abnormality. No suspicious bone lesion. Remainder of the neck reported separately on the dedicated neck CT. IMPRESSION: 1. Scalp swelling extending into the left periorbital and lateral facial soft tissues without evidence of an abscess. Electronically signed by: Dasie Hamburg MD 10/05/2024 03:42 PM EST RP Workstation: [id number]   CT Head W or Wo Contrast Result Date: 10/05/2024 EXAM: CT HEAD WITHOUT AND WITH CONTRAST 10/05/2024 02:49:18 PM TECHNIQUE: CT of the head was performed without and with the administration of 75 mL of iohexol  (OMNIPAQUE ) 350 MG/ML injection. Automated exposure control, iterative reconstruction, and/or weight based adjustment of the mA/kV was utilized to reduce the radiation dose to as low as reasonably achievable. COMPARISON: None available. CLINICAL HISTORY: has what appears to be abscess on scalp with swelling down into face/neck has what appears to be abscess on scalp with swelling down into face/neck FINDINGS: BRAIN AND VENTRICLES: There is no evidence of an acute infarct, intracranial hemorrhage, mass, midline shift, hydrocephalus, or extra-axial fluid collection. Cerebral volume is normal. No abnormal enhancement is identified. The dural venous sinuses are grossly patent. ORBITS: Reported on the separate maxillofacial CT. SINUSES AND MASTOIDS: Reported on the separate maxillofacial CT. SOFT TISSUES AND SKULL: Prominent left frontal scalp soft tissue swelling  including a more focal 2 cm region of subcutaneous inflammation potentially reflecting phlegmon with overlying skin thickening, however no organized, clearly drainable abscess is identified. No underlying bone erosion. Swelling extends inferiorly into the left periorbital region. IMPRESSION: 1. Prominent left frontal scalp soft tissue swelling/potential cellulitis  and phlegmon line without an organized drainable abscess or underlying bone erosion. 2. No acute intracranial abnormality. Electronically signed by: Dasie Hamburg MD 10/05/2024 03:37 PM EST RP Workstation: [id number]    Time coordinating discharge: 35 mins  SIGNED:  Casimer Dare, MD [facility] 10/07/24, 12:22 PM

## 2024-10-07 NOTE — TOC Transition Note (Signed)
 Transition of Care [facility]) - Discharge Note   Patient Details  Name: Gabriela Conley MRN: [id number] Date of Birth: April 22, 1966  [facility] CM[facility]SW Contact:  Roxie KANDICE Stain, RN Phone Number: 10/07/2024, 12:29 PM   Clinical Narrative:    [name] is stable to discharge home. Follow up apt on AVS. No ICM (Inpatient Care Management) needs at this time.     Final next level of care: Home/Self Care Barriers to Discharge: Barriers Resolved   Patient Goals and CMS Choice Patient states their goals for this hospitalization and ongoing recovery are:: return home          Discharge Placement               home        Discharge Plan and Services Additional resources added to the After Visit Summary for                                       Social Drivers of Health (SDOH) Interventions SDOH Screenings   Food Insecurity: No Food Insecurity (10/05/2024)  Housing: Low Risk  (10/05/2024)  Transportation Needs: No Transportation Needs (10/05/2024)  Utilities: Not At Risk (10/06/2024)  Alcohol Screen: Low Risk  (06/09/2024)  Depression (PHQ2-9): Low Risk  (05/11/2024)  Financial Resource Strain: Low Risk  (06/09/2024)  Physical Activity: Insufficiently Active (06/09/2024)  Social Connections: Moderately Isolated (06/09/2024)  Stress: No Stress Concern Present (06/09/2024)  Tobacco Use: Medium Risk (10/05/2024)     Readmission Risk Interventions    10/07/2024   12:29 PM  Readmission Risk Prevention Plan  Transportation Screening Complete  PCP or Specialist Appt within 5-7 Days Complete  Home Care Screening Complete  Medication Review (RN CM) Complete

## 2024-10-08 ENCOUNTER — Telehealth: Payer: Self-pay

## 2024-10-08 ENCOUNTER — Encounter: Payer: Self-pay | Admitting: Family Medicine

## 2024-10-08 NOTE — Transitions of Care (Post Inpatient/ED Visit) (Signed)
 10/08/2024  Name: Gabriela Conley MRN: [id number] DOB: 12-Jul-1966  Today's TOC FU Call Status: Today's TOC FU Call Status:: Successful TOC FU Call Completed Unsuccessful Call (1st Attempt) Date: 10/08/24 [facility] FU Call Complete Date: 10/08/24  Patient's Name and Date of Birth confirmed. Name, DOB  Transition Care Management Follow-up Telephone Call Date of Discharge: 10/07/24 Discharge Facility: [facility]) Type of Discharge: Inpatient Admission Primary Inpatient Discharge Diagnosis:: cellulitis How have you been since you were released from the hospital?: Better Any questions or concerns?: No  Items Reviewed: Did you receive and understand the discharge instructions provided?: Yes Medications obtained,verified, and reconciled?: Yes (Medications Reviewed) Any new allergies since your discharge?: No Dietary orders reviewed?: Yes Do you have support at home?: Yes People in Home [RPT]: child(ren), adult  Medications Reviewed Today: Medications Reviewed Today     Reviewed by Emmitt Pan, LPN (Licensed Practical Nurse) on 10/08/24 at 1042  Med List Status: <None>   Medication Order Taking? Sig Documenting Provider Last Dose Status Informant  acetaminophen  (TYLENOL ) 500 MG tablet [id number] Yes Take 1 tablet (500 mg total) by mouth every 6 (six) hours as needed.  Patient taking differently: Take 500 mg by mouth daily as needed for mild pain (pain score 1-3) or moderate pain (pain score 4-6).   Nivia Colon, PA-C  Active Self, Pharmacy Records  albuterol  (VENTOLIN  HFA) 108 [phone] Base) MCG/ACT inhaler [id number]  Inhale 1-2 puffs into the lungs every 6 (six) hours as needed for wheezing or shortness of breath.  Patient not taking: Reported on 10/08/2024   Johnny Garnette LABOR, MD  Active Self, Pharmacy Records  atorvastatin  (LIPITOR) 10 MG tablet [id number] Yes Take 1 tablet (10 mg total) by mouth daily. Johnny Garnette LABOR, MD  Active Self, Pharmacy Records           Med Note (CRUTHIS, CHLOE C    Wed Oct 06, 2024  8:19 AM) Pt is adamant she is still taking this medication daily. Dispense report does not support this claim.   augmented betamethasone  dipropionate (DIPROLENE -AF) 0.05 % cream [id number] Yes Apply topically 2 (two) times daily.  Patient taking differently: Apply 1 Application topically daily.   Johnny Garnette LABOR, MD  Active Self, Pharmacy Records  cephALEXin  (KEFLEX ) 500 MG capsule [id number] Yes Take 1 capsule (500 mg total) by mouth 3 (three) times daily for 7 days. Caleen Colander, MD  Active   fluticasone  (FLONASE ) 50 MCG/ACT nasal spray [id number]  Place 1 spray into both nostrils daily.  Patient not taking: Reported on 10/08/2024   Mayer, Jodi R, NP  Active Self, Pharmacy Records  glipiZIDE  (GLUCOTROL ) 10 MG tablet [id number] Yes TAKE 1 TABLET (10 MG TOTAL) BY MOUTH TWICE A DAY BEFORE A MEAL  Patient taking differently: Take 10 mg by mouth daily.   Johnny Garnette LABOR, MD  Active Self, Pharmacy Records           Med Note (CRUTHIS, CHLOE C   Wed Oct 06, 2024  8:20 AM) Pt is adamant she is still taking this medication daily. Dispense report does not support this claim.   levothyroxine  (SYNTHROID ) 175 MCG tablet [id number] Yes Take 1 tablet (175 mcg total) by mouth daily before breakfast. Johnny Garnette LABOR, MD  Active Self, Pharmacy Records           Med Note (CRUTHIS, CHLOE C   Wed Oct 06, 2024  8:20 AM) Pt is adamant she is still taking this medication daily. Dispense report does not support this claim.  metFORMIN  (GLUCOPHAGE ) 1000 MG tablet [id number] Yes TAKE 1 TABLET(1000 MG TOTAL) BY MOUTH DAILY WITH BREAKFAST Johnny Garnette LABOR, MD  Active Self, Pharmacy Records           Med Note (CRUTHIS, CHLOE C   Wed Oct 06, 2024  8:21 AM) Pt is adamant she is still taking this medication daily. Dispense report does not support this claim.   omeprazole  (PRILOSEC) 40 MG capsule [id number] Yes Take 1 capsule (40 mg total) by mouth daily. Johnny Garnette LABOR, MD  Active Self, Pharmacy Records           Med Note  (CRUTHIS, CHLOE C   Wed Oct 06, 2024  8:21 AM) Pt is adamant she is still taking this medication daily. Dispense report does not support this claim.   ondansetron  (ZOFRAN ) 4 MG tablet [id number] Yes Take 1 tablet (4 mg total) by mouth every 8 (eight) hours as needed.  Patient taking differently: Take 4 mg by mouth daily as needed for nausea or vomiting.   Nivia Colon, PA-C  Active Self, Pharmacy Records  pioglitazone  (ACTOS ) 45 MG tablet [id number] Yes TAKE 1 TABLET BY MOUTH EVERY DAY IN THE MORNING Johnny Garnette LABOR, MD  Active Self, Pharmacy Records           Med Note (CRUTHIS, CHLOE C   Wed Oct 06, 2024  8:21 AM) Pt is adamant she is still taking this medication daily. Dispense report does not support this claim.   traMADol  (ULTRAM ) 50 MG tablet [id number]  Take 2 tablets (100 mg total) by mouth every 6 (six) hours as needed for severe pain (pain score 7-10).  Patient not taking: Reported on 10/08/2024   Johnny Garnette LABOR, MD  Active Self, Pharmacy Records  venlafaxine  XR (EFFEXOR -XR) 150 MG 24 hr capsule [id number] Yes Take 1 capsule (150 mg total) by mouth daily with breakfast. Johnny Garnette LABOR, MD  Active Self, Pharmacy Records           Med Note (CRUTHIS, CHLOE C   Wed Oct 06, 2024  8:21 AM) Pt is adamant she is still taking this medication daily. Dispense report does not support this claim.             Home Care and Equipment/Supplies: Were Home Health Services Ordered?: NA Any new equipment or medical supplies ordered?: NA  Functional Questionnaire: Do you need assistance with bathing/showering or dressing?: No Do you need assistance with meal preparation?: No Do you need assistance with eating?: No Do you have difficulty maintaining continence: No Do you need assistance with getting out of bed/getting out of a chair/moving?: No Do you have difficulty managing or taking your medications?: No  Follow up appointments reviewed: PCP Follow-up appointment confirmed?: Yes Date of PCP follow-up  appointment?: 10/13/24 Follow-up Provider: [facility] Follow-up appointment confirmed?: No Reason Specialist Follow-Up Not Confirmed: Patient has Specialist Provider Number and will Call for Appointment Do you need transportation to your follow-up appointment?: No Do you understand care options if your condition(s) worsen?: Yes-patient verbalized understanding    SIGNATURE Julian Lemmings, LPN [facility] Nurse Health Advisor Direct Dial [phone]

## 2024-10-08 NOTE — Transitions of Care (Post Inpatient/ED Visit) (Signed)
   10/08/2024  Name: Gabriela Conley MRN: [id number] DOB: 03/13/1966  Today's TOC FU Call Status: Today's TOC FU Call Status:: Unsuccessful Call (1st Attempt) Unsuccessful Call (1st Attempt) Date: 10/08/24  Attempted to reach the patient regarding the most recent Inpatient/ED visit.  Follow Up Plan: Additional outreach attempts will be made to reach the patient to complete the Transitions of Care (Post Inpatient/ED visit) call.   Signature Julian Lemmings, LPN [facility] Nurse Health Advisor Direct Dial [phone]

## 2024-10-10 LAB — CULTURE, BLOOD (ROUTINE X 2)
Culture: NO GROWTH
Culture: NO GROWTH

## 2024-10-13 ENCOUNTER — Ambulatory Visit: Admitting: Family Medicine

## 2024-10-13 ENCOUNTER — Encounter: Payer: Self-pay | Admitting: Family Medicine

## 2024-10-13 VITALS — BP 120/90 | HR 74 | Temp 99.0°F | Ht 62.0 in | Wt 190.0 lb

## 2024-10-13 DIAGNOSIS — E11628 Type 2 diabetes mellitus with other skin complications: Secondary | ICD-10-CM

## 2024-10-13 DIAGNOSIS — Z7984 Long term (current) use of oral hypoglycemic drugs: Secondary | ICD-10-CM | POA: Diagnosis not present

## 2024-10-13 DIAGNOSIS — L03211 Cellulitis of face: Secondary | ICD-10-CM | POA: Diagnosis not present

## 2024-10-13 DIAGNOSIS — L400 Psoriasis vulgaris: Secondary | ICD-10-CM

## 2024-10-13 DIAGNOSIS — L409 Psoriasis, unspecified: Secondary | ICD-10-CM

## 2024-10-13 DIAGNOSIS — E1169 Type 2 diabetes mellitus with other specified complication: Secondary | ICD-10-CM

## 2024-10-13 MED ORDER — SITAGLIPTIN PHOSPHATE 100 MG PO TABS: 100.0000 mg | ORAL_TABLET | Freq: Every day | ORAL | 3 refills | Status: AC

## 2024-10-13 MED ORDER — BETAMETHASONE DIPROPIONATE AUG 0.05 % EX CREA: TOPICAL_CREAM | Freq: Two times a day (BID) | CUTANEOUS | 5 refills | Status: DC

## 2024-10-13 NOTE — Progress Notes (Signed)
° °  Subjective:    Patient ID: Gabriela Conley, female    DOB: [birth year], 58 y.o.   MRN: [id number]  HPI Here to follow up a hospital stay from 10-05-24 to 10-07-24 for cellulitis to the scalp and left face. She presented with swelling and redness in these areas. No fever. She had developed some plaque like leisons on her scalp a few months before, and they became very itchy. She admits to picking at these. In the hospital her WBC was slightly elevated at 11.3. Her creatinine was normal at 0.74. Her A1c was high at 7.7%. She had been taking Glipizide  BID and Pioglitazone  daily. She was supposed to be taking Metformin  also but she had stopped this because it gave her diarrhea. She had CT scans of her head, neck, and maxillofacial areas that were clear. Her blood cultures remained negative. She responded quickly to IV antibiotics, and she was sent home with 7 days of Keflex . She now feels back to normal.    Review of Systems  Constitutional: Negative.   HENT: Negative.    Eyes: Negative.   Respiratory: Negative.    Cardiovascular: Negative.   Skin:  Positive for rash.       Objective:   Physical Exam Constitutional:      Appearance: Normal appearance.  Cardiovascular:     Rate and Rhythm: Normal rate and regular rhythm.     Pulses: Normal pulses.     Heart sounds: Normal heart sounds.  Pulmonary:     Effort: Pulmonary effort is normal.     Breath sounds: Normal breath sounds.  Musculoskeletal:     Comments: There are scaly plaques of skin in several areas of her scalp. No erythema or warmth   Neurological:     Mental Status: She is alert.           Assessment & Plan:  She is recovering from a cellulitis of the scalp and face,and she will finish the keflex  tomorrow. She can use Diprolene  AF cream to the scalp to help with itching. She has plaque psoriasis, and a referral was made for her to see Dermatology. Also her type 2 diabetes is not well controlled, so will will stop the  Metformin  and begin Januvia  100 mg daily in addition to the Glipizide  and Pioglitazone . She will come back in 90 days to recheck the A1c.  Garnette Olmsted, MD

## 2024-10-14 ENCOUNTER — Other Ambulatory Visit (HOSPITAL_COMMUNITY): Payer: Self-pay

## 2024-10-14 ENCOUNTER — Telehealth: Payer: Self-pay

## 2024-10-14 NOTE — Telephone Encounter (Signed)
 Pharmacy Patient Advocate Encounter   Received notification from Onbase that prior authorization for augmented betamethasone  dipropionate (DIPROLENE -AF) 0.05 % cream  is required/requested.   Insurance verification completed.   The patient is insured through [employer].   Per test claim: PA required; PA submitted to above mentioned insurance via Latent Key/confirmation #/EOC [id number] Status is pending

## 2024-10-15 NOTE — Telephone Encounter (Signed)
 Prior Authorization form/request asks a question that requires your assistance. Please see the question below and advise accordingly. The PA will not be submitted until the necessary information is received.  PLEASE BE ADVISED PLEASE ASSIST TO PROCEED WITH PA.     See PREFERRED LIST BELOW  ciclopirox cream / solution ( Loprox, Penlac) clotrimazole Rx cream ( Lotrimin Rx) clotrimazole-betamethasone  cream (Lotrisone) ketoconazole cream / shampoo ( Nizoral) Klayesta Powder (Nystop) Nyamyc Powder (Nystop) nystatin cream / ointment / powder (Mycostatin , Nystop) Nystop Powder nystatin-triamcinolone  cream / ointment (Mycolog II*)

## 2024-10-20 MED ORDER — BETAMETHASONE VALERATE 0.1 % EX OINT: 1.0000 | TOPICAL_OINTMENT | Freq: Two times a day (BID) | CUTANEOUS | 5 refills | Status: AC

## 2024-10-20 NOTE — Telephone Encounter (Signed)
 I sent in Betamethasone  valerate instead

## 2024-10-20 NOTE — Telephone Encounter (Signed)
 NONE of these are appropriate. I prescribed Diprolene  which is a steroid cream. The list of meds enclosed are all antifungal medications. These are completely different from a steroid

## 2024-10-20 NOTE — Telephone Encounter (Signed)
 Prior Authorization form/request asks a question that requires your assistance. Please see the question below and advise accordingly. The PA will not be submitted until the necessary information is received.        is preferred by the insurance.  If suggested medication is appropriate, Please send in a new RX and discontinue this one. If not, please advise as to why it's not appropriate so that we may request a Prior Authorization. Please note, some preferred medications may still require a PA.  If the suggested medications have not been trialed and there are no contraindications to their use, the PA will not be submitted, as it will not be approved.

## 2024-10-21 ENCOUNTER — Other Ambulatory Visit (HOSPITAL_COMMUNITY): Payer: Self-pay

## 2024-10-21 NOTE — Telephone Encounter (Signed)
 Pharmacy Patient Advocate Encounter  Received notification from [employer] that Prior Authorization for Betamethasone  Dipropionate Aug 0.05% cream  has been CANCELLED due to CHANGE IN THERAPY  SEE TEST CLAIM BELOW   PA #/Case ID/Reference #: [id number]

## 2024-10-29 ENCOUNTER — Encounter: Payer: Self-pay | Admitting: Family Medicine

## 2024-11-03 NOTE — Telephone Encounter (Signed)
 Left pt a message advised to call the office back for appointment on Friday

## 2024-11-03 NOTE — Telephone Encounter (Signed)
 Create an in person appt for her to see me Friday at either 10 or 10:15. If she cannot wait that long, she should go to urgent care or the ED

## 2025-01-11 ENCOUNTER — Ambulatory Visit: Admitting: Family Medicine
